# Patient Record
Sex: Female | Born: 1985 | Hispanic: No | Marital: Married | Smoking: Never smoker
Health system: Southern US, Community
[De-identification: ages and names within clinical notes are randomized; demographics above are authoritative.]

## PROBLEM LIST (undated history)

## (undated) DIAGNOSIS — Z789 Other specified health status: Secondary | ICD-10-CM

---

## 2020-07-04 NOTE — L&D Delivery Note (Signed)
OB/GYN Faculty Practice Delivery Note  Paula Shea is a 34 y.o. G3P2002 at [redacted]w[redacted]d admitted for IOL d/t IUFD.   GBS Status: unknown   Maximum Maternal Temperature: 98.8  Labor course: Initial SVE: closed. Augmentation with: AROM and Cytotec. She then progressed to complete.  ROM: 0h 43m with dark, red, bloody fluid  Birth: At 0228 a non-viable female was delivered via vaginal birth after cesarean (VBAC) (Presentation: OA). Nuchal cord present: Yes. Nuchal cord was looped around the neck x 2, tight and tightly twisted. Cord was clamped x 2 and cut to reduce from neck. Shoulders and body delivered in usual fashion. Infant placed directly on mom's abdomen for skin-to-skin, per patient request. Cord clamped x 2 and cut by CNM.  Cord blood collected.    Pitocin infused rapidly IV per protocol. Call to Dr. Despina Hidden @ [phone] to notify that 30 minute mark since delivery is approaching what recommendations for removal of placenta. Dr. Despina Hidden recommends rectal Cytotec 800 mcg be given and patiently await delivery of placenta.   TC @ 0426 reporting trickling of blood and QBL of . TC @ 0434 reporting patient being pale, hypotensive. TC to Dr. Despina Hidden @ [phone] to update on patient status and come to room and evaluate retained placenta.  Failed attempt by CNM to manually remove @ 0443. Dr. Despina Hidden in room @ [phone].   The placenta manually removed by Dr. Despina Hidden @ [phone]. Fundus firm with massage. Placenta inspected and appears to be intact with an undeterminable number VC.  Placenta/Cord with the following complications: entire length of cord was engorged with dark red, blood, loop of cord that was around fetal neck was severely twisted in one area; which appeared to have cut off blood supply to fetus.  Cord pH: n/a Sponge and instrument count were correct x2.  Intrapartum complications:  Fetal/Neonatal Death Anesthesia:  epidural Episiotomy: none Lacerations:  none Suture Repair:  n/a EBL (mL): 1574   Infant: APGAR (1  MIN): 0   APGAR (5 MINS): 0   Infant weight: pending  Mom to  OBSCU .  Baby  with mother then to morgue . Placenta to Pathology for IUFD    Plans to  n/a Contraception:  unsure Circumcision: N/A  Note sent to [facility]: [facility] for pp visit.  Raelyn Mora, MSN, CNM 01/24/2021 3:51 AM

## 2020-08-21 ENCOUNTER — Encounter (HOSPITAL_COMMUNITY): Payer: Self-pay

## 2020-08-21 ENCOUNTER — Ambulatory Visit (HOSPITAL_COMMUNITY)
Admission: EM | Admit: 2020-08-21 | Discharge: 2020-08-21 | Disposition: A | Discharge status: Home / Self Care | Payer: Self-pay | Attending: Medical Oncology | Admitting: Medical Oncology

## 2020-08-21 ENCOUNTER — Other Ambulatory Visit: Payer: Self-pay

## 2020-08-21 DIAGNOSIS — B354 Tinea corporis: Secondary | ICD-10-CM

## 2020-08-21 DIAGNOSIS — Z3202 Encounter for pregnancy test, result negative: Secondary | ICD-10-CM

## 2020-08-21 DIAGNOSIS — N3001 Acute cystitis with hematuria: Secondary | ICD-10-CM

## 2020-08-21 DIAGNOSIS — R109 Unspecified abdominal pain: Secondary | ICD-10-CM

## 2020-08-21 LAB — POCT URINALYSIS DIPSTICK, ED / UC
Bilirubin Urine: NEGATIVE
Glucose, UA: NEGATIVE mg/dL
Ketones, ur: NEGATIVE mg/dL
Nitrite: NEGATIVE
Protein, ur: 30 mg/dL — AB
Specific Gravity, Urine: 1.01 (ref 1.005–1.030)
Urobilinogen, UA: 0.2 mg/dL (ref 0.0–1.0)
pH: 5.5 (ref 5.0–8.0)

## 2020-08-21 LAB — POC URINE PREG, ED: Preg Test, Ur: NEGATIVE

## 2020-08-21 MED ORDER — PRENATAL COMPLETE 14-0.4 MG PO TABS: 1.0000 | ORAL_TABLET | Freq: Every day | ORAL | 3 refills | Status: DC

## 2020-08-21 MED ORDER — CLOTRIMAZOLE 1 % EX CREA: TOPICAL_CREAM | CUTANEOUS | 0 refills | Status: DC

## 2020-08-21 MED ORDER — NITROFURANTOIN MONOHYD MACRO 100 MG PO CAPS: 100.0000 mg | ORAL_CAPSULE | Freq: Two times a day (BID) | ORAL | 0 refills | Status: DC

## 2020-08-21 NOTE — ED Provider Notes (Signed)
[facility]    CSN: [id number] Arrival date & time: 08/21/20  1420      History   Chief Complaint Dysuria   HPI Kaysee Hergert is a 35 y.o. female. PT accompanied by [facility] representative as well as a medical interpretor via phone.   HPI   Abdominal Pain: PT reports that for the past 3 days she has had lower abdominal pain. Mainly in the RLQ area. She has also had urinary frequency, low back pain, dysuria and hematuria. Symptoms are similar to past UTIs. Symptoms started after sexual intercourse with her husband. She denies fever, chills, V/D. She has not taken anything for symptoms. No recent UTI. She would like a pregnancy test as she often only gets UTIs when pregnant    History reviewed. No pertinent past medical history.  There are no problems to display for this patient.   Past Surgical History:  Procedure Laterality Date  . CESAREAN SECTION      OB History   No obstetric history on file.      Home Medications    Prior to Admission medications   Not on File    Family History History reviewed. No pertinent family history.  Social History Social History   Tobacco Use  . Smoking status: Never Smoker  . Smokeless tobacco: Never Used  Substance Use Topics  . Alcohol use: Never  . Drug use: Never     Allergies   Patient has no known allergies.   Review of Systems Review of Systems  As stated above in HPI  Physical Exam Triage Vital Signs ED Triage Vitals [08/21/20 1540]  Enc Vitals Group     BP 120/81     Pulse Rate 74     Resp 18     Temp 98.6 F (37 C)     Temp Source Oral     SpO2 100 %     Weight      Height      Head Circumference      Peak Flow      Pain Score      Pain Loc      Pain Edu?      Excl. in GC?    No data found.  Updated Vital Signs BP 120/81 (BP Location: Left Arm)   Pulse 74   Temp 98.6 F (37 C) (Oral)   Resp 18   LMP 08/08/2020   SpO2 100%   Physical Exam Vitals and nursing note reviewed.   Constitutional:      General: She is not in acute distress.    Appearance: Normal appearance. She is not ill-appearing, toxic-appearing or diaphoretic.  HENT:     Head: Normocephalic.     Mouth/Throat:     Mouth: Mucous membranes are moist.  Eyes:     Comments: No evidence of pallor  Cardiovascular:     Rate and Rhythm: Normal rate and regular rhythm.     Heart sounds: Normal heart sounds.  Pulmonary:     Effort: Pulmonary effort is normal.     Breath sounds: Normal breath sounds.  Abdominal:     General: Abdomen is flat. Bowel sounds are normal. There is no distension.     Palpations: Abdomen is soft. There is no mass.     Tenderness: There is no abdominal tenderness. There is no right CVA tenderness, left CVA tenderness, guarding or rebound.     Hernia: No hernia is present.  Musculoskeletal:     Cervical  back: Neck supple.  Lymphadenopathy:     Cervical: No cervical adenopathy.  Skin:    General: Skin is warm and dry.  Neurological:     Mental Status: She is alert.      UC Treatments / Results  Labs (all labs ordered are listed, but only abnormal results are displayed) Labs Reviewed  POCT URINALYSIS DIPSTICK, ED / UC - Abnormal; Notable for the following components:      Result Value   Hgb urine dipstick LARGE (*)    Protein, ur 30 (*)    Leukocytes,Ua SMALL (*)    All other components within normal limits  POC URINE PREG, ED    EKG   Radiology No results found.  Procedures Procedures (including critical care time)  Medications Ordered in UC Medications - No data to display  Initial Impression / Assessment and Plan / UC Course  I have reviewed the triage vital signs and the nursing notes.  Pertinent labs & imaging results that were available during my care of the patient were reviewed by me and considered in my medical decision making (see chart for details).     New. Likely UTI.  Sending in [place] for her.  Discussed how to use along with common  potential side effects and precautions.  Culture pending.  If symptoms fail to improve over the next 3 days she will let us know.  Instructed her to drink plenty of fluids and to try to urinate after sexual intercourse to help avoid UTI eyes in the future.  As she is planning for pregnancy and does not have access to prenatals at this time I am going to send these to the pharmacy for her.  She also at the end of our visit shows me a well demarcated slightly hyperpigmented rash with clearing of her arm.  I will send an antifungal cream for her and I discussed how to use this.  She is aware that it can take a few months to fully resolve. Final Clinical Impressions(s) / UC Diagnoses   Final diagnoses:  None   Discharge Instructions   None    ED Prescriptions    None     PDMP not reviewed this encounter.   Rushie Chestnut, [place] 08/21/20 1628

## 2020-08-21 NOTE — ED Triage Notes (Signed)
Pt c/o RLQ abdominal pain, right lower back/flank pain, burning with urination, hematuria onset approx 3 days.  Denies fever, chills, v/d.  No OTC meds for symptoms States h/o recurrent UTIs.

## 2020-10-16 LAB — OB RESULTS CONSOLE PLATELET COUNT: Platelets: 246

## 2020-10-16 LAB — OB RESULTS CONSOLE RPR: RPR: NONREACTIVE

## 2020-10-16 LAB — OB RESULTS CONSOLE TSH: TSH: 5.07

## 2020-10-16 LAB — OB RESULTS CONSOLE HGB/HCT, BLOOD
HCT: 38 (ref 29–41)
Hemoglobin: 12.5

## 2020-10-16 LAB — HIV ANTIBODY (ROUTINE TESTING W REFLEX): HIV Screen 4th Generation wRfx: NONREACTIVE

## 2020-10-16 LAB — HEPATITIS C ANTIBODY: HCV Ab: NEGATIVE

## 2020-10-16 LAB — GLUCOSE, FASTING: Glucose Fasting: 78

## 2020-12-07 ENCOUNTER — Encounter: Payer: Self-pay | Admitting: *Deleted

## 2020-12-14 DIAGNOSIS — Z3400 Encounter for supervision of normal first pregnancy, unspecified trimester: Secondary | ICD-10-CM | POA: Insufficient documentation

## 2020-12-14 DIAGNOSIS — Z3492 Encounter for supervision of normal pregnancy, unspecified, second trimester: Secondary | ICD-10-CM

## 2020-12-14 HISTORY — DX: Encounter for supervision of normal first pregnancy, unspecified trimester: Z34.00

## 2020-12-24 ENCOUNTER — Ambulatory Visit (INDEPENDENT_AMBULATORY_CARE_PROVIDER_SITE_OTHER): Payer: Medicaid Other | Admitting: Family Medicine

## 2020-12-24 ENCOUNTER — Other Ambulatory Visit (HOSPITAL_COMMUNITY)
Admission: RE | Admit: 2020-12-24 | Discharge: 2020-12-24 | Disposition: A | Discharge status: Home / Self Care | Payer: Medicaid Other | Source: Ambulatory Visit | Attending: Family Medicine | Admitting: Family Medicine

## 2020-12-24 ENCOUNTER — Other Ambulatory Visit: Payer: Self-pay

## 2020-12-24 ENCOUNTER — Encounter: Payer: Self-pay | Admitting: Family Medicine

## 2020-12-24 VITALS — BP 104/67 | HR 77 | Ht 64.0 in | Wt 168.0 lb

## 2020-12-24 DIAGNOSIS — Z3492 Encounter for supervision of normal pregnancy, unspecified, second trimester: Secondary | ICD-10-CM | POA: Diagnosis not present

## 2020-12-24 DIAGNOSIS — Z3400 Encounter for supervision of normal first pregnancy, unspecified trimester: Secondary | ICD-10-CM

## 2020-12-24 DIAGNOSIS — Z3A18 18 weeks gestation of pregnancy: Secondary | ICD-10-CM | POA: Diagnosis not present

## 2020-12-24 DIAGNOSIS — O34219 Maternal care for unspecified type scar from previous cesarean delivery: Secondary | ICD-10-CM

## 2020-12-24 DIAGNOSIS — Z98891 History of uterine scar from previous surgery: Secondary | ICD-10-CM | POA: Insufficient documentation

## 2020-12-24 LAB — POCT URINALYSIS DIP (DEVICE)
Bilirubin Urine: NEGATIVE
Glucose, UA: NEGATIVE mg/dL
Ketones, ur: NEGATIVE mg/dL
Leukocytes,Ua: NEGATIVE
Nitrite: NEGATIVE
Protein, ur: NEGATIVE mg/dL
Specific Gravity, Urine: 1.025 (ref 1.005–1.030)
Urobilinogen, UA: 0.2 mg/dL (ref 0.0–1.0)
pH: 7 (ref 5.0–8.0)

## 2020-12-24 MED ORDER — BLOOD PRESSURE KIT DEVI: 1.0000 | 0 refills | Status: DC | PRN

## 2020-12-24 NOTE — Progress Notes (Signed)
INITIAL PRENATAL VISIT  Subjective:   Paula Shea is being seen today for her first obstetrical visit.  This is a planned pregnancy. This is a desired pregnancy.  She is at [redacted]w[redacted]d gestation by LMP.  Her obstetrical history is significant for  CSx2 . Relationship with FOB: spouse, living together. Patient does intend to breast feed. Pregnancy history fully reviewed.  Patient reports no complaints.  Indications for ASA therapy (per uptodate) One of the following: Previous pregnancy with preeclampsia, especially early onset and with an adverse outcome No Multifetal gestation No Chronic hypertension No Type 1 or 2 diabetes mellitus No Chronic kidney disease No Autoimmune disease (antiphospholipid syndrome, systemic lupus erythematosus) No  Two or more of the following: Nulliparity No Obesity (body mass index >30 kg/m2) No Family history of preeclampsia in mother or sister No Age ?35 years No Sociodemographic characteristics (African American race, low socioeconomic level) No Personal risk factors (eg, previous pregnancy with low birth weight or small for gestational age infant, previous adverse pregnancy outcome [eg, stillbirth], interval >10 years between pregnancies) No  Indications for early GDM screening  First-degree relative with diabetes No BMI >30kg/m2 No Age > 25 No Previous birth of an infant weighing ?4000 g No Gestational diabetes mellitus in a previous pregnancy No Glycated hemoglobin ?5.7 percent (39 mmol/mol), impaired glucose tolerance, or impaired fasting glucose on previous testing No High-risk race/ethnicity (eg, African American, Latino, Native American, Asian American, Pacific Islander) No Previous stillbirth of unknown cause No Maternal birthweight > 9 lbs No History of cardiovascular disease No Hypertension or on therapy for hypertension No High-density lipoprotein cholesterol level <35 mg/dL (9.50 mmol/L) and/or a triglyceride level >250 mg/dL (9.32  mmol/L) No Polycystic ovary syndrome No Physical inactivity No Other clinical condition associated with insulin resistance (eg, severe obesity, acanthosis nigricans) No Current use of glucocorticoids No   Early screening tests: FBS, A1C, Random CBG, glucose challenge   Review of Systems:   Review of Systems  Objective:    Obstetric History OB History  Gravida Para Term Preterm AB Living  3 2 2  0 0 2  SAB IAB Ectopic Multiple Live Births  0 0 0 0 2    # Outcome Date GA Lbr Len/2nd Weight Sex Delivery Anes PTL Lv  3 Current           2 Term 01/07/17 [redacted]w[redacted]d  8 lb 13.1 oz (4 kg) M CS-Unspec EPI N LIV  1 Term 02/15/15 [redacted]w[redacted]d  8 lb 13.1 oz (4 kg) M CS-Unspec EPI  LIV    History reviewed. No pertinent past medical history.  Past Surgical History:  Procedure Laterality Date   CESAREAN SECTION     2x    Current Outpatient Medications on File Prior to Visit  Medication Sig Dispense Refill   clotrimazole (LOTRIMIN) 1 % cream Apply to affected area 2 times daily (Patient not taking: Reported on 12/24/2020) 15 g 0   nitrofurantoin, macrocrystal-monohydrate, (MACROBID) 100 MG capsule Take 1 capsule (100 mg total) by mouth 2 (two) times daily. (Patient not taking: Reported on 12/24/2020) 10 capsule 0   ondansetron (ZOFRAN) 4 MG tablet Take 4 mg by mouth every 8 (eight) hours as needed for nausea or vomiting. (Patient not taking: Reported on 12/24/2020)     Prenatal Vit-Fe Fumarate-FA (PRENATAL COMPLETE) 14-0.4 MG TABS Take 1 tablet by mouth daily. (Patient not taking: Reported on 12/24/2020) 60 tablet 3   No current facility-administered medications on file prior to visit.    No  Known Allergies  Social History:  reports that she has never smoked. She has never used smokeless tobacco. She reports that she does not drink alcohol and does not use drugs.  History reviewed. No pertinent family history.  The following portions of the patient's history were reviewed and updated as  appropriate: allergies, current medications, past family history, past medical history, past social history, past surgical history and problem list.  Review of Systems Review of Systems    Physical Exam:  BP 104/67   Pulse 77   Ht 5\' 4"  (1.626 m)   Wt 168 lb (76.2 kg)   LMP 08/14/2020   BMI 28.84 kg/m  CONSTITUTIONAL: Well-developed, well-nourished female in no acute distress.  HENT:  Normocephalic, atraumatic, External right and left ear normal. Oropharynx is clear and moist EYES: Conjunctivae normal. No scleral icterus.  NECK: Normal range of motion, supple, no masses.  Normal thyroid.  SKIN: Skin is warm and dry. No rash noted. Not diaphoretic. No erythema. No pallor. MUSCULOSKELETAL: Normal range of motion. No tenderness.  No cyanosis, clubbing, or edema.   NEUROLOGIC: Alert and oriented to person, place, and time. Normal muscle tone coordination.  PSYCHIATRIC: Normal mood and affect. Normal behavior. Normal judgment and thought content. CARDIOVASCULAR: Normal heart rate noted, regular rhythm RESPIRATORY: Clear to auscultation bilaterally. Effort and breath sounds normal, no problems with respiration noted. BREASTS: Symmetric in size. No masses, skin changes, nipple drainage, or lymphadenopathy. ABDOMEN: Soft, normal bowel sounds, no distention noted.  No tenderness, rebound or guarding. Fundal ht: 18 PELVIC: Normal appearing external genitalia; normal appearing vaginal mucosa and cervix.  No abnormal discharge noted.  Pap smear obtained.  Normal uterine size, no other palpable masses, no uterine or adnexal tenderness. FHR: 157   Assessment:    Pregnancy: G1P0000 1. Supervision of low-risk pregnancy, second trimester - Hemoglobin A1c - Genetic Screening - Culture, OB Urine - AFP, Serum, Open Spina Bifida - Cytology - PAP( [place]) - Cervicovaginal ancillary only( [place]) - CBC/D/Plt+RPR+Rh+ABO+RubIgG...  2. Supervision of normal first pregnancy, antepartum Up to  date  3. H/O cesarean section complicating pregnancy RCS     Plan:     Initial labs drawn. Prenatal vitamins. Problem list reviewed and updated. Reviewed in detail the nature of the practice with collaborative care between  Genetic screening discussed: NIPS/First trimester screen/Quad/AFP requested. Role of ultrasound in pregnancy discussed; Anatomy 10/12/2020: requested. Amniocentesis discussed: not indicated. Follow up in 4 weeks. Discussed clinic routines, schedule of care and testing, genetic screening options, involvement of students and residents under the direct supervision of APPs and doctors and presence of female providers. Pt verbalized understanding.   [place], MD 12/24/2020 3:21 PM

## 2020-12-25 ENCOUNTER — Other Ambulatory Visit: Payer: Self-pay | Admitting: Obstetrics & Gynecology

## 2020-12-25 DIAGNOSIS — Z3492 Encounter for supervision of normal pregnancy, unspecified, second trimester: Secondary | ICD-10-CM

## 2020-12-25 LAB — CERVICOVAGINAL ANCILLARY ONLY
Bacterial Vaginitis (gardnerella): NEGATIVE
Candida Glabrata: NEGATIVE
Candida Vaginitis: NEGATIVE
Chlamydia: NEGATIVE
Comment: NEGATIVE
Comment: NEGATIVE
Comment: NEGATIVE
Comment: NEGATIVE
Comment: NEGATIVE
Comment: NORMAL
Neisseria Gonorrhea: NEGATIVE
Trichomonas: NEGATIVE

## 2020-12-25 MED ORDER — PRENATAL COMPLETE 14-0.4 MG PO TABS
1.0000 | ORAL_TABLET | Freq: Every day | ORAL | 3 refills | Status: DC
Start: 2020-12-25 — End: 2021-02-19

## 2020-12-26 LAB — AFP, SERUM, OPEN SPINA BIFIDA
AFP MoM: 1.04
AFP Value: 47.9 ng/mL
Gest. Age on Collection Date: 18.6 weeks
Maternal Age At EDD: 33.7 yr
OSBR Risk 1 IN: 10000
Test Results:: NEGATIVE
Weight: 168 [lb_av]

## 2020-12-26 LAB — CBC/D/PLT+RPR+RH+ABO+RUBIGG...
Antibody Screen: NEGATIVE
Basophils Absolute: 0 10*3/uL (ref 0.0–0.2)
Basos: 1 %
EOS (ABSOLUTE): 0.1 10*3/uL (ref 0.0–0.4)
Eos: 1 %
HCV Ab: 0.1 s/co ratio (ref 0.0–0.9)
HIV Screen 4th Generation wRfx: NONREACTIVE
Hematocrit: 36.6 % (ref 34.0–46.6)
Hemoglobin: 12.6 g/dL (ref 11.1–15.9)
Hepatitis B Surface Ag: NEGATIVE
Immature Grans (Abs): 0 10*3/uL (ref 0.0–0.1)
Immature Granulocytes: 0 %
Lymphocytes Absolute: 1.6 10*3/uL (ref 0.7–3.1)
Lymphs: 21 %
MCH: 31.3 pg (ref 26.6–33.0)
MCHC: 34.4 g/dL (ref 31.5–35.7)
MCV: 91 fL (ref 79–97)
Monocytes Absolute: 0.4 10*3/uL (ref 0.1–0.9)
Monocytes: 5 %
Neutrophils Absolute: 5.4 10*3/uL (ref 1.4–7.0)
Neutrophils: 72 %
Platelets: 289 10*3/uL (ref 150–450)
RBC: 4.03 x10E6/uL (ref 3.77–5.28)
RDW: 12.4 % (ref 11.7–15.4)
RPR Ser Ql: NONREACTIVE
Rh Factor: POSITIVE
Rubella Antibodies, IGG: 2.86 index (ref >0.99)
WBC: 7.5 10*3/uL (ref 3.4–10.8)

## 2020-12-26 LAB — HEMOGLOBIN A1C
Est. average glucose Bld gHb Est-mCnc: 97 mg/dL
Hgb A1c MFr Bld: 5 % (ref 4.8–5.6)

## 2020-12-26 LAB — URINE CULTURE, OB REFLEX

## 2020-12-26 LAB — CULTURE, OB URINE

## 2020-12-26 LAB — HCV INTERPRETATION

## 2020-12-28 LAB — CYTOLOGY - PAP
Comment: NEGATIVE
Diagnosis: NEGATIVE
High risk HPV: NEGATIVE

## 2021-01-05 ENCOUNTER — Other Ambulatory Visit: Payer: Self-pay | Admitting: Lactation Services

## 2021-01-05 NOTE — Progress Notes (Signed)
Called pharmacy to clarify prescription for PNV. They report they could not obtain most recent prescription but have one on file from February that can fill. No new orders needed at this time.

## 2021-01-15 ENCOUNTER — Other Ambulatory Visit: Payer: Self-pay

## 2021-01-15 ENCOUNTER — Encounter (HOSPITAL_COMMUNITY): Payer: Self-pay | Admitting: Emergency Medicine

## 2021-01-15 ENCOUNTER — Ambulatory Visit (HOSPITAL_COMMUNITY)
Admission: EM | Admit: 2021-01-15 | Discharge: 2021-01-15 | Disposition: A | Discharge status: Home / Self Care | Payer: Medicaid Other

## 2021-01-15 ENCOUNTER — Emergency Department (HOSPITAL_COMMUNITY)
Admission: EM | Admit: 2021-01-15 | Discharge: 2021-01-15 | Disposition: A | Discharge status: Home / Self Care | Payer: Medicaid Other | Attending: Emergency Medicine | Admitting: Emergency Medicine

## 2021-01-15 ENCOUNTER — Emergency Department (HOSPITAL_BASED_OUTPATIENT_CLINIC_OR_DEPARTMENT_OTHER): Payer: Medicaid Other

## 2021-01-15 DIAGNOSIS — Z3A20 20 weeks gestation of pregnancy: Secondary | ICD-10-CM | POA: Diagnosis not present

## 2021-01-15 DIAGNOSIS — M7989 Other specified soft tissue disorders: Secondary | ICD-10-CM

## 2021-01-15 DIAGNOSIS — L03115 Cellulitis of right lower limb: Secondary | ICD-10-CM

## 2021-01-15 DIAGNOSIS — W57XXXA Bitten or stung by nonvenomous insect and other nonvenomous arthropods, initial encounter: Secondary | ICD-10-CM | POA: Insufficient documentation

## 2021-01-15 DIAGNOSIS — M79604 Pain in right leg: Secondary | ICD-10-CM | POA: Diagnosis not present

## 2021-01-15 DIAGNOSIS — O99711 Diseases of the skin and subcutaneous tissue complicating pregnancy, first trimester: Secondary | ICD-10-CM | POA: Diagnosis not present

## 2021-01-15 DIAGNOSIS — O9A211 Injury, poisoning and certain other consequences of external causes complicating pregnancy, first trimester: Secondary | ICD-10-CM | POA: Diagnosis not present

## 2021-01-15 DIAGNOSIS — M79661 Pain in right lower leg: Secondary | ICD-10-CM | POA: Insufficient documentation

## 2021-01-15 DIAGNOSIS — Z3A01 Less than 8 weeks gestation of pregnancy: Secondary | ICD-10-CM | POA: Diagnosis not present

## 2021-01-15 DIAGNOSIS — S80861A Insect bite (nonvenomous), right lower leg, initial encounter: Secondary | ICD-10-CM | POA: Insufficient documentation

## 2021-01-15 MED ORDER — CEPHALEXIN 250 MG PO CAPS
500.0000 mg | ORAL_CAPSULE | Freq: Once | ORAL | Status: AC
  Administered 2021-01-15: 500 mg via ORAL
  Filled 2021-01-15: qty 2

## 2021-01-15 MED ORDER — CEPHALEXIN 500 MG PO CAPS: 500.0000 mg | ORAL_CAPSULE | Freq: Three times a day (TID) | ORAL | 0 refills | Status: AC

## 2021-01-15 MED ORDER — ACETAMINOPHEN 325 MG PO TABS: 650.0000 mg | ORAL_TABLET | Freq: Four times a day (QID) | ORAL | 0 refills | Status: DC | PRN

## 2021-01-15 MED ORDER — ACETAMINOPHEN 500 MG PO TABS
1000.0000 mg | ORAL_TABLET | Freq: Once | ORAL | Status: AC
  Administered 2021-01-15: 1000 mg via ORAL
  Filled 2021-01-15: qty 2

## 2021-01-15 NOTE — ED Provider Notes (Signed)
[facility] EMERGENCY DEPARTMENT Provider Note   CSN: [id number] Arrival date & time: 01/15/21  1831     History Chief Complaint  Patient presents with   Insect Bite    Paula Shea is a 35 y.o. female who reports she is [redacted] weeks gestation present emergency department with pain in her right leg.  The patient reports she was stung by mosquitoes 3 days ago.  This occurred on her right lower leg.  She reports she has had swelling and pain in her right leg.  She has taken a small dose of Tylenol at home but no other medicines.  She denies fevers or chills.  She has never had this issue before.  She reports no other complications with her pregnancy.  She reports no known drug allergies.  HPI     History reviewed. No pertinent past medical history.  Patient Active Problem List   Diagnosis Date Noted   H/O cesarean section complicating pregnancy 12/24/2020   Supervision of normal first pregnancy, antepartum 12/14/2020    Past Surgical History:  Procedure Laterality Date   CESAREAN SECTION     2x     OB History     Gravida  3   Para  2   Term  2   Preterm  0   AB  0   Living  2      SAB  0   IAB  0   Ectopic  0   Multiple  0   Live Births  2           History reviewed. No pertinent family history.  Social History   Tobacco Use   Smoking status: Never   Smokeless tobacco: Never  Substance Use Topics   Alcohol use: Never   Drug use: Never    Home Medications Prior to Admission medications   Medication Sig Start Date End Date Taking? Authorizing Provider  acetaminophen (TYLENOL) 325 MG tablet Take 2 tablets (650 mg total) by mouth every 6 (six) hours as needed for up to 30 doses (Pain). 01/15/21  Yes Demetrie Borge, Kermit Balo, MD  cephALEXin (KEFLEX) 500 MG capsule Take 1 capsule (500 mg total) by mouth 3 (three) times daily for 7 days. 01/15/21 01/22/21 Yes Athanasios Heldman, Kermit Balo, MD  Blood Pressure Monitoring (BLOOD PRESSURE KIT) DEVI 1  Device by Does not apply route as needed. 12/24/20   Federico Flake, MD  clotrimazole (LOTRIMIN) 1 % cream Apply to affected area 2 times daily Patient not taking: Reported on 12/24/2020 08/21/20   Rushie Chestnut, PA-C  nitrofurantoin, macrocrystal-monohydrate, (MACROBID) 100 MG capsule Take 1 capsule (100 mg total) by mouth 2 (two) times daily. Patient not taking: No sig reported 08/21/20   Rushie Chestnut, PA-C  ondansetron (ZOFRAN) 4 MG tablet Take 4 mg by mouth every 8 (eight) hours as needed for nausea or vomiting. Patient not taking: Reported on 12/24/2020    [provider]  Prenatal Vit-Fe Fumarate-FA (PRENATAL COMPLETE) 14-0.4 MG TABS Take 1 tablet by mouth daily. 12/25/20   Adam Phenix, MD    Allergies    Patient has no known allergies.  Review of Systems   Review of Systems  Constitutional:  Negative for chills and fever.  Eyes:  Negative for pain and visual disturbance.  Respiratory:  Negative for cough and shortness of breath.   Cardiovascular:  Negative for chest pain and palpitations.  Gastrointestinal:  Negative for abdominal pain and vomiting.  Genitourinary:  Negative for dysuria and hematuria.  Musculoskeletal:  Positive for arthralgias.  Skin:  Positive for rash. Negative for color change.  Neurological:  Negative for syncope and headaches.  All other systems reviewed and are negative.  Physical Exam Updated Vital Signs BP 123/84 (BP Location: Left Arm)   Pulse 68   Temp 98.5 F (36.9 C)   Resp 18   LMP 08/14/2020   SpO2 100%   Physical Exam Constitutional:      General: She is not in acute distress. HENT:     Head: Normocephalic and atraumatic.  Eyes:     Conjunctiva/sclera: Conjunctivae normal.     Pupils: Pupils are equal, round, and reactive to light.  Cardiovascular:     Rate and Rhythm: Normal rate and regular rhythm.  Pulmonary:     Effort: Pulmonary effort is normal. No respiratory distress.  Abdominal:     General:  There is no distension.     Tenderness: There is no abdominal tenderness.  Skin:    General: Skin is warm and dry.     Comments: Erythema and warmth of right lower leg involving top of foot to mid-calf  Neurological:     General: No focal deficit present.     Mental Status: She is alert and oriented to person, place, and time. Mental status is at baseline.     Sensory: No sensory deficit.     Motor: No weakness.  Psychiatric:        Mood and Affect: Mood normal.        Behavior: Behavior normal.    ED Results / Procedures / Treatments   Labs (all labs ordered are listed, but only abnormal results are displayed) Labs Reviewed - No data to display  EKG None  Radiology VAS [place] LOWER EXTREMITY VENOUS (DVT) (ONLY [facility] & [facility])  Result Date: 01/15/2021  Lower Venous DVT Study Patient Name:  BERANIA PEEDIN  Date of Exam:   01/15/2021 Medical Rec #: [id number]      Accession #:    [id number] Date of Birth: Mar 16, 1986       Patient Gender: F Patient Age:   35Y Exam Location:  [facility] Procedure:      VAS [place] LOWER EXTREMITY VENOUS (DVT) Referring Phys: [id number] Rudell Cobb BADALAMENTE --------------------------------------------------------------------------------  Indications: Pain, and Swelling.  Comparison Study: no prior Performing Technologist: Archie Patten RVS  Examination Guidelines: A complete evaluation includes B-mode imaging, spectral Doppler, color Doppler, and power Doppler as needed of all accessible portions of each vessel. Bilateral testing is considered an integral part of a complete examination. Limited examinations for reoccurring indications may be performed as noted. The reflux portion of the exam is performed with the patient in reverse Trendelenburg.  +---------+---------------+---------+-----------+----------+--------------+ RIGHT    CompressibilityPhasicitySpontaneityPropertiesThrombus Aging +---------+---------------+---------+-----------+----------+--------------+  CFV      Full           Yes      Yes                                 +---------+---------------+---------+-----------+----------+--------------+ SFJ      Full                                                        +---------+---------------+---------+-----------+----------+--------------+ FV  Prox  Full                                                        +---------+---------------+---------+-----------+----------+--------------+ FV Mid   Full                                                        +---------+---------------+---------+-----------+----------+--------------+ FV DistalFull                                                        +---------+---------------+---------+-----------+----------+--------------+ PFV      Full                                                        +---------+---------------+---------+-----------+----------+--------------+ POP      Full           Yes      Yes                                 +---------+---------------+---------+-----------+----------+--------------+ PTV      Full                                                        +---------+---------------+---------+-----------+----------+--------------+ PERO     Full                                                        +---------+---------------+---------+-----------+----------+--------------+   +----+---------------+---------+-----------+----------+--------------+ LEFTCompressibilityPhasicitySpontaneityPropertiesThrombus Aging +----+---------------+---------+-----------+----------+--------------+ CFV Full           Yes      Yes                                 +----+---------------+---------+-----------+----------+--------------+     Summary: RIGHT: - There is no evidence of deep vein thrombosis in the lower extremity.  - No cystic structure found in the popliteal fossa.  LEFT: - No evidence of common femoral vein obstruction.  *See table(s) above  for measurements and observations.    Preliminary     Procedures Procedures   Medications Ordered in ED Medications  acetaminophen (TYLENOL) tablet 1,000 mg (1,000 mg Oral Given 01/15/21 2148)  cephALEXin (KEFLEX) capsule 500 mg (500 mg Oral Given 01/15/21 2148)    ED Course  I have reviewed the triage vital signs and the nursing notes.  Pertinent labs & imaging results that were available during my care of  the patient were reviewed by me and considered in my medical decision making (see chart for details).  This is a well-appearing 35 year old female present emerged department with pain and warmth in her right leg after being stung by mosquito 3 days ago.  Clinically there is most consistent with a mild cellulitis.  She has no systemic signs of toxicity.  She is afebrile.  I doubt a deep space infection.  Vascular ultrasound did not show any evidence of a DVT.  We will start her on Tylenol for pain as well as Keflex for skin infection.  Per medical review, Keflex does appear to be safe to give in pregnancy.    Final Clinical Impression(s) / ED Diagnoses Final diagnoses:  Insect bite, unspecified site, initial encounter  Cellulitis of right lower extremity    Rx / DC Orders ED Discharge Orders          Ordered    cephALEXin (KEFLEX) 500 MG capsule  3 times daily        01/15/21 2119    acetaminophen (TYLENOL) 325 MG tablet  Every 6 hours PRN        01/15/21 2119             Wyvonnia Dusky, MD 01/15/21 2328

## 2021-01-15 NOTE — Discharge Instructions (Addendum)
You wre diagnosed with a skin infection in the emergency department.  This is called cellulitis.    You will need to take the antibiotic I prescribed for 7 days. The medicine is called Keflex.  You will take 1 pill by mouth three times per day - once in the morning, once with lunch, and once before bedtime.  Please finish the ENTIRE week of antibiotics.  Do not stop taking antibiotics early.  For pain at home you can take the Tylenol I prescribed.    These medicines are safe in pregnancy.  I expect your pain and redness to get better.  If it gets worse, you should return to the ER.  Please follow-up with the OB/GYN provider.  While you were here we also did a study to check for blood clots in your legs.  We did not see signs of blood clots in your legs.

## 2021-01-15 NOTE — ED Provider Notes (Signed)
[place]    CSN: [id number] Arrival date & time: 01/15/21  1723      History   Chief Complaint Chief Complaint  Patient presents with   Insect Bite    HPI Paula Shea is a 35 y.o. female presenting with R ankle pain and swelling following insect bite that occurred 3 days ago. She is currently 5 months pregnant.  Progressively worsening pain and swelling of her right ankle, now radiating through the calf and groin.  The swelling extends up to the knee.  Denies fever/chills, dizziness, shortness of breath, chest pain.  HPI  History reviewed. No pertinent past medical history.  Patient Active Problem List   Diagnosis Date Noted   H/O cesarean section complicating pregnancy 54/27/0623   Supervision of normal first pregnancy, antepartum 12/14/2020    Past Surgical History:  Procedure Laterality Date   CESAREAN SECTION     2x    OB History     Gravida  3   Para  2   Term  2   Preterm  0   AB  0   Living  2      SAB  0   IAB  0   Ectopic  0   Multiple  0   Live Births  2            Home Medications    Prior to Admission medications   Medication Sig Start Date End Date Taking? Authorizing Provider  Blood Pressure Monitoring (BLOOD PRESSURE KIT) DEVI 1 Device by Does not apply route as needed. 12/24/20   Caren Macadam, MD  clotrimazole (LOTRIMIN) 1 % cream Apply to affected area 2 times daily Patient not taking: Reported on 12/24/2020 08/21/20   Hughie Closs, PA-C  nitrofurantoin, macrocrystal-monohydrate, (MACROBID) 100 MG capsule Take 1 capsule (100 mg total) by mouth 2 (two) times daily. Patient not taking: No sig reported 08/21/20   Hughie Closs, PA-C  ondansetron (ZOFRAN) 4 MG tablet Take 4 mg by mouth every 8 (eight) hours as needed for nausea or vomiting. Patient not taking: Reported on 12/24/2020    [provider]  Prenatal Vit-Fe Fumarate-FA (PRENATAL COMPLETE) 14-0.4 MG TABS Take 1 tablet by mouth  daily. 12/25/20   Woodroe Mode, MD    Family History History reviewed. No pertinent family history.  Social History Social History   Tobacco Use   Smoking status: Never   Smokeless tobacco: Never  Substance Use Topics   Alcohol use: Never   Drug use: Never     Allergies   Patient has no known allergies.   Review of Systems Review of Systems  Musculoskeletal:        R calf swelling and tenderness  All other systems reviewed and are negative.   Physical Exam Triage Vital Signs ED Triage Vitals  Enc Vitals Group     BP 01/15/21 1757 119/72     Pulse Rate 01/15/21 1757 77     Resp 01/15/21 1757 18     Temp 01/15/21 1757 (!) 97.5 F (36.4 C)     Temp Source 01/15/21 1757 Tympanic     SpO2 01/15/21 1757 100 %     Weight --      Height --      Head Circumference --      Peak Flow --      Pain Score 01/15/21 1802 10     Pain Loc --      Pain Edu? --  Excl. in GC? --    No data found.  Updated Vital Signs BP 119/72   Pulse 77   Temp (!) 97.5 F (36.4 C) (Tympanic)   Resp 18   LMP 08/14/2020   SpO2 100%   Visual Acuity Right Eye Distance:   Left Eye Distance:   Bilateral Distance:    Right Eye Near:   Left Eye Near:    Bilateral Near:     Physical Exam Vitals reviewed.  Constitutional:      Appearance: Normal appearance.  HENT:     Head: Normocephalic and atraumatic.  Cardiovascular:     Rate and Rhythm: Normal rate.  Pulmonary:     Effort: Pulmonary effort is normal.  Musculoskeletal:     Comments: 2+ pitting edema R foot and calf. Positive homan sign. Swelling extends to knee.   Skin:    Capillary Refill: Capillary refill takes less than 2 seconds.  Neurological:     General: No focal deficit present.     Mental Status: She is alert and oriented to person, place, and time.  Psychiatric:        Mood and Affect: Mood normal.        Behavior: Behavior normal.        Thought Content: Thought content normal.        Judgment: Judgment  normal.     UC Treatments / Results  Labs (all labs ordered are listed, but only abnormal results are displayed) Labs Reviewed - No data to display  EKG   Radiology No results found.  Procedures Procedures (including critical care time)  Medications Ordered in UC Medications - No data to display  Initial Impression / Assessment and Plan / UC Course  I have reviewed the triage vital signs and the nursing notes.  Pertinent labs & imaging results that were available during my care of the patient were reviewed by me and considered in my medical decision making (see chart for details).     This patient is a very pleasant 35 y.o. year old female presenting with RLE edema and pain following insect bite. Afebrile, nontachy. Significant R LE swelling and positive homan sign, Wells score 4. Pain radiating to groin. Given time of day (6pm Friday), I am not able to order and outpatient ultrasound at this time. As patient is also 5 months pregnant and there is a language barrier, I am not comfortable managing this outpatient. I am recommending they head to ED for DVT rule out. Patient verbalizes understanding. Spoke with this patient using language line.  Final Clinical Impressions(s) / UC Diagnoses   Final diagnoses:  Swelling of right lower extremity  [redacted] weeks gestation of pregnancy     Discharge Instructions      Head to ED for DVT r/o     ED Prescriptions   None    PDMP not reviewed this encounter.   Hazel Sams, PA-C 01/15/21 1821

## 2021-01-15 NOTE — ED Triage Notes (Signed)
Pt sent from UC for further evaluation on insect bite on her right ankle, pt is five months pregnant having swollen ankle and knee since Wednesday after the bite.

## 2021-01-15 NOTE — ED Notes (Signed)
Patient is being discharged from the Urgent Care and sent to the Emergency Department via POV . Per Ignacia Bayley PA-C, patient is in need of higher level of care due to swelling and pain in her right foot radiating to her groin. Pt is also five months pregnant . Patient is aware and verbalizes understanding of plan of care.  Vitals:   01/15/21 1757  BP: 119/72  Pulse: 77  Resp: 18  Temp: (!) 97.5 F (36.4 C)  SpO2: 100%

## 2021-01-15 NOTE — ED Triage Notes (Addendum)
Pt is present today with a insect bite on her right ankle on Wednesday. Pt has visible  swelling in her ankle and knee. Pt states that pain radiates up her groin.  Pt is five months pregnant.

## 2021-01-15 NOTE — ED Provider Notes (Signed)
Emergency Medicine Provider Triage Evaluation Note  Paula Shea , a 35 y.o. female  was evaluated in triage.  Pt complains of swelling to right ankle and lower extremity.  Patient was sent from urgent care due to concern for possible DVT.    Patient states that she was bit by mosquito on Wednesday.  Since then she has had worsening swelling and redness to right foot and knee.  Review of Systems  Positive: Swelling, myalgia Negative: Numbness, weakness  Physical Exam  BP 118/80   Pulse 77   Temp 98.4 F (36.9 C) (Oral)   Resp 14   LMP 08/14/2020   SpO2 99%  Gen:   Awake, no distress   Resp:  Normal effort  MSK:   Moves extremities without difficulty  Other:    Medical Decision Making  Medically screening exam initiated at 6:48 PM.  Appropriate orders placed.  Mercia Lagoy was informed that the remainder of the evaluation will be completed by another provider, this initial triage assessment does not replace that evaluation, and the importance of remaining in the ED until their evaluation is complete.  Will place order for DVT based on patient's increased Wells DVT score.  Patient patient is negative for DVT consider antibiotic for cellulitis secondary to bug bite.    The patient appears stable so that the remainder of the work up may be completed by another provider.      Haskel Schroeder, PA-C 01/15/21 1850    Charlynne Pander, MD 01/15/21 [phone]

## 2021-01-15 NOTE — Progress Notes (Signed)
Lower extremity venous has been completed.   Preliminary results in CV Proc.   Blanch Media 01/15/2021 7:05 PM

## 2021-01-15 NOTE — ED Notes (Signed)
Dari Interpreter# [id number]

## 2021-01-15 NOTE — Discharge Instructions (Addendum)
Head to ED for DVT r/o

## 2021-01-19 ENCOUNTER — Encounter: Payer: Self-pay | Admitting: *Deleted

## 2021-01-20 ENCOUNTER — Encounter: Payer: Self-pay | Admitting: *Deleted

## 2021-01-20 ENCOUNTER — Ambulatory Visit: Payer: Medicaid Other | Attending: Family Medicine

## 2021-01-21 ENCOUNTER — Ambulatory Visit: Payer: Medicaid Other | Admitting: *Deleted

## 2021-01-21 ENCOUNTER — Ambulatory Visit: Payer: Medicaid Other | Attending: Obstetrics and Gynecology

## 2021-01-21 ENCOUNTER — Other Ambulatory Visit: Payer: Self-pay

## 2021-01-21 ENCOUNTER — Ambulatory Visit (INDEPENDENT_AMBULATORY_CARE_PROVIDER_SITE_OTHER): Payer: Medicaid Other | Admitting: Obstetrics and Gynecology

## 2021-01-21 ENCOUNTER — Ambulatory Visit: Payer: Medicaid Other | Attending: Obstetrics and Gynecology | Admitting: Obstetrics and Gynecology

## 2021-01-21 ENCOUNTER — Encounter: Payer: Medicaid Other | Admitting: Obstetrics and Gynecology

## 2021-01-21 VITALS — BP 113/62 | HR 67

## 2021-01-21 VITALS — BP 114/78 | HR 74 | Wt 168.6 lb

## 2021-01-21 DIAGNOSIS — O364XX0 Maternal care for intrauterine death, not applicable or unspecified: Secondary | ICD-10-CM

## 2021-01-21 DIAGNOSIS — Z3A22 22 weeks gestation of pregnancy: Secondary | ICD-10-CM

## 2021-01-21 DIAGNOSIS — O322XX0 Maternal care for transverse and oblique lie, not applicable or unspecified: Secondary | ICD-10-CM

## 2021-01-21 DIAGNOSIS — O368390 Maternal care for abnormalities of the fetal heart rate or rhythm, unspecified trimester, not applicable or unspecified: Secondary | ICD-10-CM | POA: Insufficient documentation

## 2021-01-21 DIAGNOSIS — Z3400 Encounter for supervision of normal first pregnancy, unspecified trimester: Secondary | ICD-10-CM

## 2021-01-21 DIAGNOSIS — O34219 Maternal care for unspecified type scar from previous cesarean delivery: Secondary | ICD-10-CM | POA: Insufficient documentation

## 2021-01-21 DIAGNOSIS — O368320 Maternal care for abnormalities of the fetal heart rate or rhythm, second trimester, not applicable or unspecified: Secondary | ICD-10-CM

## 2021-01-21 HISTORY — DX: Maternal care for abnormalities of the fetal heart rate or rhythm, unspecified trimester, not applicable or unspecified: O36.8390

## 2021-01-21 IMAGING — US US MFM OB LIMITED
1 series · 16 of 25 positions shown · non-contrast
Comparison: none

Addendum:\.br----------------------------------------------------------------------
----------------------------------------------------------------------

 1  US MFM OB LIMITED                     76815.01    LUIS DAVID P NARANJO MALIZA
Indications
 Maternal Care of intrauterine death, not
 applicable or unspecified
 22 weeks gestation of pregnancy
 Unable to hear fetal heart tones as reason     O76
 for ultrasound
 Previous cesarean delivery, antepartum
 Low risk NIPS, neg AFP/Horizon
Fetal Evaluation
 Num Of Fetuses:         1
 Cardiac Activity:       Absent
 Presentation:           Transverse, head to maternal left
 Placenta:               Anterior
 Amniotic Fluid
 AFI FV:      Subjectively decreased
                             Largest Pocket(cm)
Biometry
 BPD:      49.6  mm     G. Age:  21w 0d        2.2  %    CI:        67.76   %    70 - 86
                                                         FL/HC:      20.9   %    19.2 -
 HC:      192.8  mm     G. Age:  21w 4d        3.2  %    HC/AC:      1.09        1.05 -
 AC:      177.4  mm     G. Age:  22w 4d         34  %    FL/BPD:     81.0   %    71 - 87
 FL:       40.2  mm     G. Age:  23w 0d         43  %    FL/AC:      22.7   %    20 - 24
 Est. FW:     516  gm      1 lb 2 oz     29  %
OB History
 Gravidity:    3         Term:   2        Prem:   0        SAB:   0
 TOP:          0       Ectopic:  0        Living: 2
Gestational Age
 LMP:           22w 6d        Date:  08/14/20                 EDD:   05/21/21
 U/S Today:     22w 0d                                        EDD:   05/27/21
 Best:          22w 6d     Det. By:  LMP  (08/14/20)          EDD:   05/21/21
Cervix Uterus Adnexa
 Cervix
 Length:           4.23  cm.
 Normal appearance by transabdominal scan.
 Uterus
 No abnormality visualized.
 Right Ovary
 Not visualized.
 Left Ovary
 Cul De Sac
 No free fluid seen.
 Adnexa
Impression
 A limited ultrasound study was performed.  Unfortunately, no
 fetal heart activity seen.  Fetal biometry is consistent with 22
 weeks gestation.

[Series 1: us mfm ob limited · 25 acquisitions, 16 frames shown]
[im 1/25]
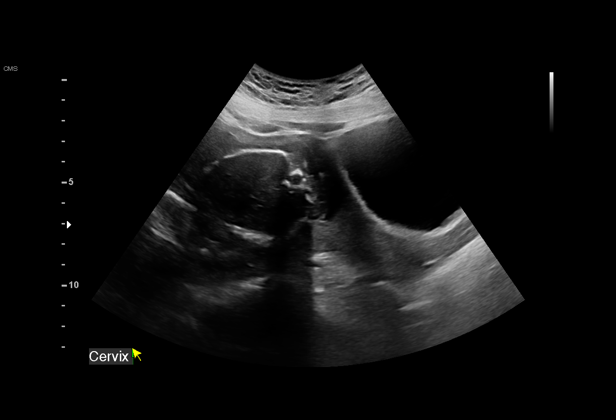
[im 3/25]
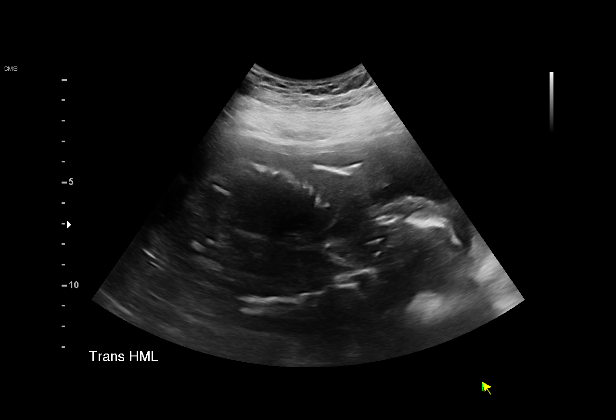
[im 4/25]
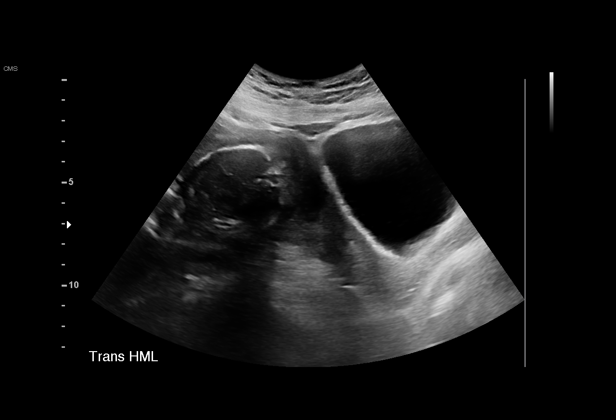
[im 6/25]
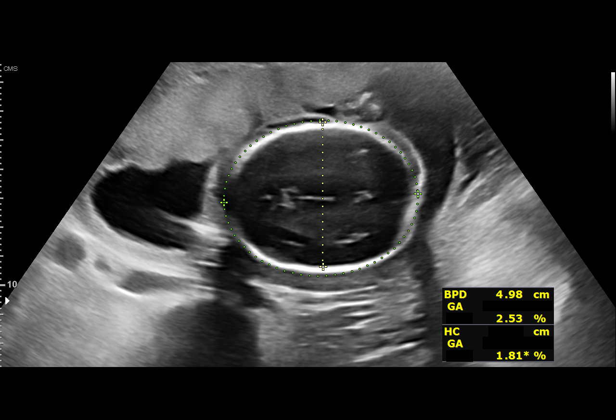
[im 8/25]
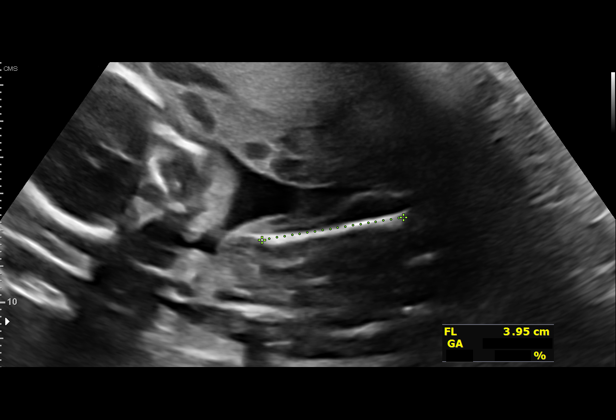
[im 9/25]
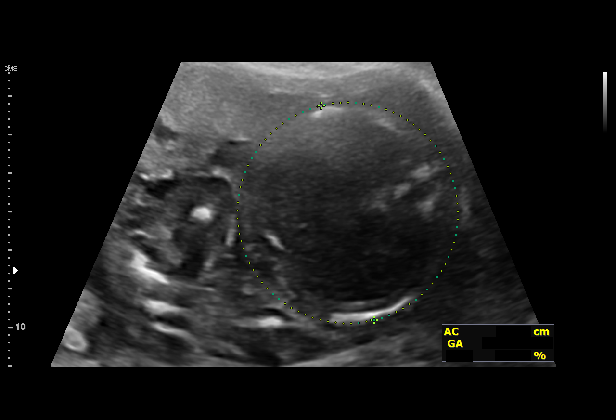
[im 11/25]
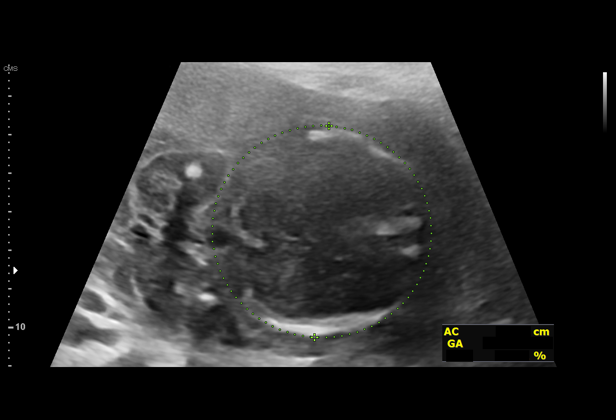
[im 12/25]
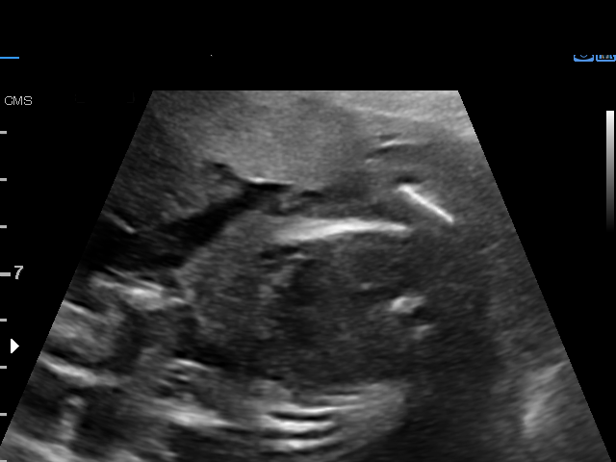
[im 14/25]
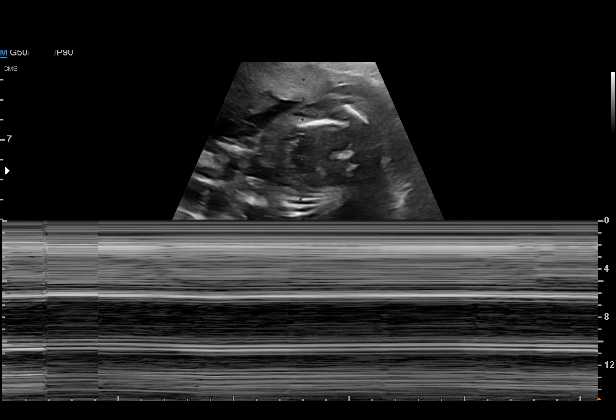
[im 15/25]
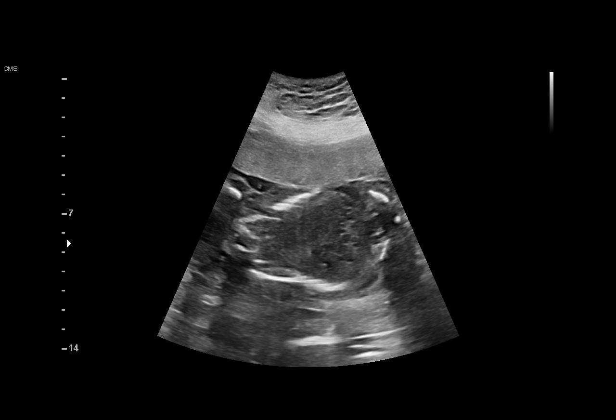
[im 17/25]
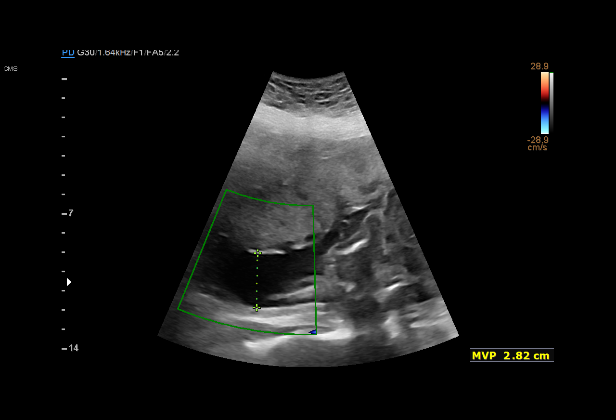
[im 18/25]
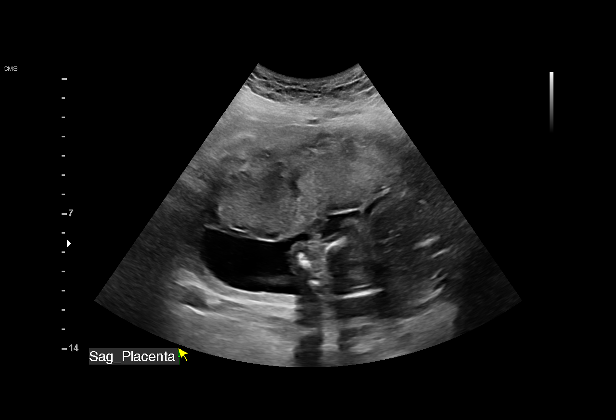
[im 20/25]
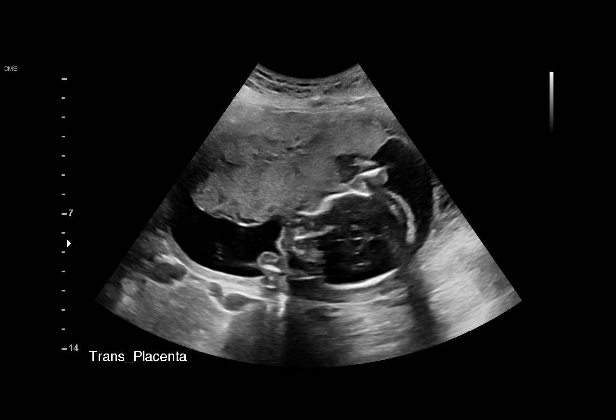
[im 22/25]
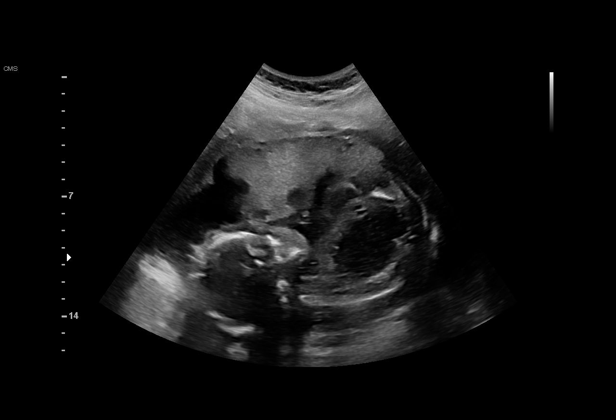
[im 23/25]
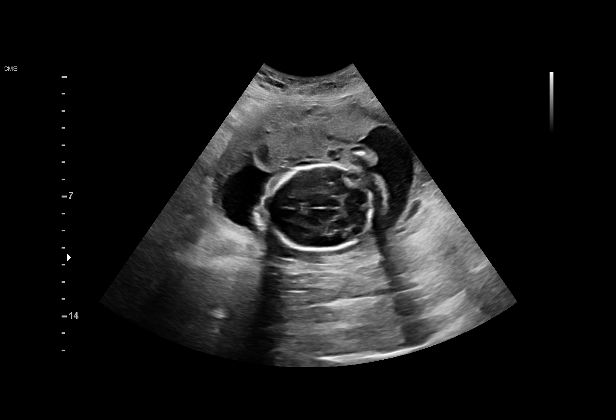
[im 25/25]
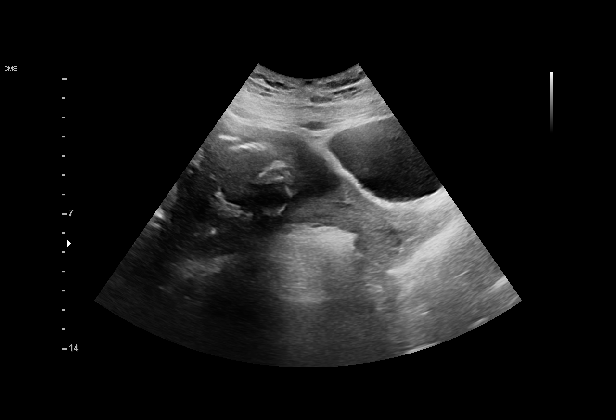

[16 of 25 positions shown; findings below may reference images not displayed]

IMPRESSION: Intrauterine fetal demise.
 [id number]
 Consultation

 Ms. Kbrera, Yilian3 P2 at 22-weeks' gestation, was sent over to
 our office after fetal heart tones were not heard on Doppler at
 your office.
 Patient does not give history of vaginal bleeding.  She
 complained of decreased fetal movements this morning.
 Obstetrical history significant for 2 term cesarean deliveries.
 She reports no chronic medical conditions.

 I counseled the patient with help of Arabic language
 interpreter present in the room.  I explained the possible
 causes including fetal chromosomal anomalies.  I discussed
 the option of amniocentesis.  Alternatively, fetal karyotype
 and MicroArray can be performed after delivery. Patient opted
 not to have amniocentesis.
 Patient had 2 previous cesarean deliveries and
 uncomplicated pregnancies.  She does not have any chronic
 medical conditions that could be attributed to fetal demise.

 I spoke to her husband on the phone.  Patient would like to
 go home and be with family.

 Discussed with Dr. Tahmina, OB attending on call.
 I informed the patient that she should expect a call from our
 hospital to schedule her induction of labor.
----------------------------------------------------------------------
Recommendations

 -Misoprostol induction of labor tomorrow
 -Postnatal fetal karyotype/microarray (PASWAN).
----------------------------------------------------------------------
                      [place], [place]
----------------------------------------------------------------------

*** End of Addendum ***\.br----------------------------------------------------------------------
----------------------------------------------------------------------

----------------------------------------------------------------------

----------------------------------------------------------------------

 1  US MFM OB LIMITED                     76815.01    LUIS DAVID P NARANJO MALIZA
----------------------------------------------------------------------

----------------------------------------------------------------------
Indications

 22 weeks gestation of pregnancy
 Unable to hear fetal heart tones as reason     O76
 for ultrasound
 Previous cesarean delivery, antepartum
 Low risk NIPS, neg AFP/Horizon
----------------------------------------------------------------------
Fetal Evaluation

 Num Of Fetuses:         1
 Cardiac Activity:       Absent
 Presentation:           Transverse, head to maternal left
 Placenta:               Anterior

 Amniotic Fluid
 AFI FV:      Subjectively decreased

                             Largest Pocket(cm)

----------------------------------------------------------------------
Biometry

 BPD:      49.6  mm     G. Age:  21w 0d        2.2  %    CI:        67.76   %    70 - 86
                                                         FL/HC:      20.9   %    19.2 -
 HC:      192.8  mm     G. Age:  21w 4d        3.2  %    HC/AC:      1.09        1.05 -
 AC:      177.4  mm     G. Age:  22w 4d         34  %    FL/BPD:     81.0   %    71 - 87
 FL:       40.2  mm     G. Age:  23w 0d         43  %    FL/AC:      22.7   %    20 - 24

 Est. FW:     516  gm      1 lb 2 oz     29  %
----------------------------------------------------------------------
OB History
 Gravidity:    3         Term:   2        Prem:   0        SAB:   0
 TOP:          0       Ectopic:  0        Living: 2
----------------------------------------------------------------------
Gestational Age

 LMP:           22w 6d        Date:  08/14/20                 EDD:   05/21/21
 U/S Today:     22w 0d                                        EDD:   05/27/21
 Best:          22w 6d     Det. By:  LMP  (08/14/20)          EDD:   05/21/21
----------------------------------------------------------------------
Cervix Uterus Adnexa

 Cervix
 Length:           4.23  cm.
 Normal appearance by transabdominal scan.

 Uterus
 No abnormality visualized.

 Right Ovary
 Not visualized.

 Left Ovary
 Not visualized.

 Cul De Sac
 No free fluid seen.

 Adnexa
 No abnormality visualized.
----------------------------------------------------------------------
Impression

 G3 P2.  Ultrasound was requested after fetal heart tones
 were not detected at your office.
 A limited ultrasound study was performed.  Unfortunately, no
 fetal heart activity seen.  Fetal biometry is consistent with 22
 weeks gestation.
IMPRESSION: Intrauterine fetal demise.

 I counseled the patient with help of Arabic language
 interpreter present in the room.  I explained the possible
 causes including fetal chromosomal anomalies.  I discussed
 the option of amniocentesis.  Alternatively, fetal karyotype
 and MicroArray can be performed after delivery.

 I spoke to her husband on the phone.  Patient would like to
 go home and be with family.

 Discussed with Dr. Tahmina, OB attending on call.
 I informed the patient that she should expect a call from our
 hospital to schedule her induction of labor.
----------------------------------------------------------------------
                 [place], [place]
----------------------------------------------------------------------

## 2021-01-21 NOTE — Progress Notes (Addendum)
Subjective:  Paula Shea is a 35 y.o. G3P2002 at [redacted]w[redacted]d being seen today for ongoing prenatal care.  She is currently monitored for the following issues for this low-risk pregnancy and has Supervision of normal first pregnancy, antepartum and H/O cesarean section complicating pregnancy on their problem list.  Patient reports  some cramping in upper abdomen for 1 day .  Contractions: Irritability. Vag. Bleeding: None.  Movement: Present (per patient). Denies leaking of fluid.   The following portions of the patient's history were reviewed and updated as appropriate: allergies, current medications, past family history, past medical history, past social history, past surgical history and problem list. Problem list updated.  Patient denies vaginal bleeding. Reports she had some upper abdominal discomfort 1 day ago. Denies any other cramping.   Objective:   Vitals:   01/21/21 1529  Weight: 168 lb 9.6 oz (76.5 kg)    Fetal Status: FHT not appreciated on doppler and bedside exam.  General:  Alert, oriented and cooperative. Patient is in no acute distress.  Skin: Skin is warm and dry. No rash noted.   Cardiovascular: Normal heart rate noted  Respiratory: Normal respiratory effort, no problems with respiration noted  Abdomen: Soft, gravid, appropriate for gestational age. Pain/Pressure: Present     Pelvic: Vag. Bleeding: None     Cervical exam deferred        Extremities: Normal range of motion.  Edema: Trace  Mental Status: Normal mood and affect. Normal behavior. Normal judgment and thought content.    Assessment and Plan:  Pregnancy: G3P2002 at [redacted]w[redacted]d  1. Supervision of normal first pregnancy, antepartum 2. No Fetal Heart Tone on doppler and bedside US Genetic screen negative, all other labs reviewed. No heart tones were appreciated on bedside doppler and on follow up bedside US with iPAD probe no fetal heart tone seen. OB comp [place] scheduled on 7/20 but patient not aware and did not get  [place].  At that time BSUS done after patient informed that the ultrasound is considered a limited OB ultrasound and is not intended to be a complete ultrasound exam.  Patient also informed that the ultrasound is not being completed with the intent of assessing for fetal or placental anomalies or any pelvic abnormalities.  Explained that the purpose of today's ultrasound is to assess for  viability.  No fetal heart tones were seen on fetal US and low fluid, patient sent to MFM for formal US for confirmation of viability of pregnancy. Patient acknowledges the purpose of the exam and the limitations of the study.   - Discussed with patient that no heartbeat seen on fetal US and need for confirmation on formal US. -sent to get formal Ultrasound today (01/21/2021) with plan for [place] tech to reach out to on call OB with results if no FHT on formal US. First call OB (Dr. Donavan Foil) notified by phone regarding patient.    Warner Mccreedy, MD, MPH OB Fellow, Faculty Practice  Attestation of Attending Supervision of Advanced Practitioner (CNM/NP): Evaluation and management procedures were performed by the Advanced Practitioner under my supervision and collaboration.  I performed the bedside ultrasound myself as well. I have reviewed the Advanced Practitioner's note and chart, and I agree with the management and plan.  Peggy Constant 01/21/2021 4:38 PM

## 2021-01-22 NOTE — Progress Notes (Signed)
Maternal-Fetal Medicine   Name: Paula Shea DOB: 1985/10/27 MRN: [id number] Referring Provider: Catalina Antigua, MD  Ms. Paula Libra, G3 P2 at 22-weeks' gestation, was sent over to our office after fetal heart tones were not heard on Doppler at your office. Patient does not give history of vaginal bleeding.  She complained of decreased fetal movements this morning. Obstetrical history significant for 2 term cesarean deliveries.   She reports no chronic medical conditions. Ultrasound A limited ultrasound study was performed.  Unfortunately, no fetal heart activity seen.  Fetal biometry is consistent with [redacted] weeks gestation. Impression: Intrauterine fetal demise.  I counseled the patient with help of Arabic language interpreter present in the room.  I explained the possible causes including fetal chromosomal anomalies.  I discussed the option of amniocentesis.  Alternatively, fetal karyotype and MicroArray can be performed after delivery. Patient opted not to have amniocentesis. Patient had 2 previous cesarean deliveries and uncomplicated pregnancies.  She does not have any chronic medical conditions that could be attributed to fetal demise.  I spoke to her husband on the phone.  Patient would like to go home and be with family.  Discussed with Dr. Donavan Foil, [facility] attending on call. I informed the patient that she should expect a call from our hospital to schedule her induction of labor.  Recommendations: -Misoprostol induction of labor tomorrow -Postnatal fetal karyotype/microarray Kizzie Fantasia).  Thank you for consultation.  If you have any questions or concerns, please contact me the [facility].  Consultation including face-to-face (more than 50%) counseling 30 minutes.

## 2021-01-23 ENCOUNTER — Encounter (HOSPITAL_COMMUNITY): Payer: Self-pay | Admitting: Obstetrics and Gynecology

## 2021-01-23 ENCOUNTER — Inpatient Hospital Stay (HOSPITAL_COMMUNITY)
Admission: AD | Admit: 2021-01-23 | Discharge: 2021-01-24 | DRG: 806 | Disposition: A | Discharge status: Home / Self Care | Payer: Medicaid Other | Attending: Obstetrics & Gynecology | Admitting: Obstetrics & Gynecology

## 2021-01-23 ENCOUNTER — Inpatient Hospital Stay (HOSPITAL_COMMUNITY): Payer: Medicaid Other

## 2021-01-23 DIAGNOSIS — Z20822 Contact with and (suspected) exposure to covid-19: Secondary | ICD-10-CM | POA: Diagnosis present

## 2021-01-23 DIAGNOSIS — O34211 Maternal care for low transverse scar from previous cesarean delivery: Secondary | ICD-10-CM | POA: Diagnosis not present

## 2021-01-23 DIAGNOSIS — Z3A23 23 weeks gestation of pregnancy: Secondary | ICD-10-CM

## 2021-01-23 DIAGNOSIS — O34219 Maternal care for unspecified type scar from previous cesarean delivery: Secondary | ICD-10-CM | POA: Diagnosis present

## 2021-01-23 DIAGNOSIS — O364XX1 Maternal care for intrauterine death, fetus 1: Secondary | ICD-10-CM | POA: Diagnosis not present

## 2021-01-23 DIAGNOSIS — O364XX0 Maternal care for intrauterine death, not applicable or unspecified: Secondary | ICD-10-CM

## 2021-01-23 DIAGNOSIS — Z3400 Encounter for supervision of normal first pregnancy, unspecified trimester: Secondary | ICD-10-CM

## 2021-01-23 HISTORY — DX: Other specified health status: Z78.9

## 2021-01-23 HISTORY — DX: Maternal care for intrauterine death, not applicable or unspecified: O36.4XX0

## 2021-01-23 LAB — TYPE AND SCREEN
ABO/RH(D): O POS
Antibody Screen: NEGATIVE

## 2021-01-23 LAB — CBC
HCT: 36.6 % (ref 36.0–46.0)
Hemoglobin: 12.1 g/dL (ref 12.0–15.0)
MCH: 30.9 pg (ref 26.0–34.0)
MCHC: 33.1 g/dL (ref 30.0–36.0)
MCV: 93.4 fL (ref 80.0–100.0)
Platelets: 292 10*3/uL (ref 150–400)
RBC: 3.92 MIL/uL (ref 3.87–5.11)
RDW: 12.4 % (ref 11.5–15.5)
WBC: 6.6 10*3/uL (ref 4.0–10.5)
nRBC: 0 % (ref 0.0–0.2)

## 2021-01-23 MED ORDER — MISOPROSTOL 200 MCG PO TABS
ORAL_TABLET | ORAL | Status: AC
  Administered 2021-01-23: 400 ug via ORAL
  Filled 2021-01-23: qty 2

## 2021-01-23 MED ORDER — LIDOCAINE HCL (PF) 1 % IJ SOLN: 30.0000 mL | INTRAMUSCULAR | Status: DC | PRN

## 2021-01-23 MED ORDER — ACETAMINOPHEN 325 MG PO TABS
650.0000 mg | ORAL_TABLET | ORAL | Status: DC | PRN
  Filled 2021-01-23: qty 2

## 2021-01-23 MED ORDER — OXYCODONE-ACETAMINOPHEN 5-325 MG PO TABS
2.0000 | ORAL_TABLET | ORAL | Status: DC | PRN
Start: 2021-01-23 — End: 2021-01-24

## 2021-01-23 MED ORDER — OXYCODONE-ACETAMINOPHEN 5-325 MG PO TABS: 1.0000 | ORAL_TABLET | ORAL | Status: DC | PRN

## 2021-01-23 MED ORDER — FENTANYL CITRATE (PF) 100 MCG/2ML IJ SOLN
100.0000 ug | INTRAMUSCULAR | Status: DC | PRN
  Administered 2021-01-23 (×2): 100 ug via INTRAVENOUS
  Filled 2021-01-23 (×2): qty 2

## 2021-01-23 MED ORDER — ONDANSETRON HCL 4 MG/2ML IJ SOLN
4.0000 mg | Freq: Four times a day (QID) | INTRAMUSCULAR | Status: DC | PRN
  Administered 2021-01-23 – 2021-01-24 (×2): 4 mg via INTRAVENOUS
  Filled 2021-01-23 (×2): qty 2

## 2021-01-23 MED ORDER — MISOPROSTOL 200 MCG PO TABS
400.0000 ug | ORAL_TABLET | ORAL | Status: DC
  Administered 2021-01-23 (×2): 400 ug via ORAL
  Filled 2021-01-23 (×2): qty 2

## 2021-01-23 MED ORDER — LACTATED RINGERS IV SOLN
500.0000 mL | INTRAVENOUS | Status: DC | PRN
  Administered 2021-01-24: 1000 mL via INTRAVENOUS

## 2021-01-23 MED ORDER — OXYTOCIN BOLUS FROM INFUSION
333.0000 mL | Freq: Once | INTRAVENOUS | Status: AC
  Administered 2021-01-24 (×2): 333 mL via INTRAVENOUS

## 2021-01-23 MED ORDER — SOD CITRATE-CITRIC ACID 500-334 MG/5ML PO SOLN: 30.0000 mL | ORAL | Status: DC | PRN

## 2021-01-23 MED ORDER — OXYTOCIN-SODIUM CHLORIDE 30-0.9 UT/500ML-% IV SOLN
2.5000 [IU]/h | INTRAVENOUS | Status: DC
  Administered 2021-01-24 (×2): 2.5 [IU]/h via INTRAVENOUS
  Filled 2021-01-23 (×2): qty 500

## 2021-01-23 MED ORDER — LACTATED RINGERS IV SOLN: INTRAVENOUS | Status: DC

## 2021-01-23 NOTE — Progress Notes (Signed)
Paula Shea is a 35 y.o. G3P2002 at [redacted]w[redacted]d by LMP admitted for induction of labor due to IUFD.  Subjective: Called by RN to check cervix. Time for next Cytotec placement. Pain management with IV Fentanyl. Patient nauseated.  Objective: BP 117/73   Pulse 70   Temp 98.6 F (37 C) (Oral)   Resp 18   Ht 5\' 4"  (1.626 m)   Wt 76.5 kg   LMP 08/14/2020   SpO2 100%   BMI 28.94 kg/m  No intake/output data recorded. No intake/output data recorded.  FHT:  N/A UC:   irregular, every 1-4 minutes SVE:   Dilation: 1.5 Effacement (%): 60 Presentation: Undeterminable Station: Ballotable Exam by:: [email or web address], CNM  Labs: Lab Results  Component Value Date   WBC 6.6 01/23/2021   HGB 12.1 01/23/2021   HCT 36.6 01/23/2021   MCV 93.4 01/23/2021   PLT 292 01/23/2021    Assessment / Plan: Induction of labor due to fetal demise,  progressing well on cytotec  Labor: Progressing normally Preeclampsia:   n/a Fetal Wellbeing:   n/a Pain Control:  IV pain meds I/D:  n/a Anticipated MOD:  NSVD  01/25/2021, CNM 01/23/2021, 10:04 PM

## 2021-01-23 NOTE — Progress Notes (Signed)
13:08 Sharen Counter, CNM, spent an extensive amount of time at the patient's bedside with the interpreter iPad, explaining in detail the typical process and why.    I requested that the patient teach back what she understood about the process that was just explained and she was unable.  I spent an additional 45 minutes explaining the IUFD induction process and allowing time for any questions that she and her partner may have.   Patient is a previous c-section x 2 and will be receiving misoprostol for induction based on IUFD and preterm status.  Dr. Alysia Penna, midwife, and patient all in agreement on plan of care.

## 2021-01-23 NOTE — Progress Notes (Signed)
Paula Shea is a 35 y.o. G3P2002 at [redacted]w[redacted]d admitted for induction of labor due to IUFD.  Subjective: Starting to feel some cramping and back pain. She still does not want an epidural yet until she feels more pain. She will ask for pain medicine when she feels its needed  Objective: BP 116/72   Pulse 70   Ht 5\' 4"  (1.626 m)   Wt 76.5 kg   LMP 08/14/2020   BMI 28.94 kg/m  No intake/output data recorded. No intake/output data recorded.  FHT:  NA UC:   none SVE:   Dilation: 1 Effacement (%): 20 Exam by:: Mary [email or web address] Johnson, RN  Labs: Lab Results  Component Value Date   WBC 6.6 01/23/2021   HGB 12.1 01/23/2021   HCT 36.6 01/23/2021   MCV 93.4 01/23/2021   PLT 292 01/23/2021    Assessment / Plan: Induction of labor due to IUFD, progressing via cytotec  Labor:  Cytotec 01/25/2021 q4hr, making adequate change Fetal Wellbeing:   NA Pain Control:   Epidural and IV pain medication as desired I/D:  n/a Anticipated MOD:  NSVD  MD 01/23/2021, 7:31 PM

## 2021-01-23 NOTE — H&P (Signed)
OBSTETRIC ADMISSION HISTORY AND PHYSICAL  Paula Shea is a 35 y.o. female G3P2002 with IUP at 38w1dby LMP presenting for IOL for IUFD, diagnosed by [place] 01/21/21. Pregnancy has been uncomplicated prior to this diagnosis with history of C/S x 2 for prior deliveries.  She denies painful contractions, LOF, VB, blurry vision, headaches, peripheral edema, or RUQ pain.  She is undecided about contraception. [facility]    Dating: By LMP --->  Estimated Date of Delivery: 05/21/21  Sono:    _0 , no fetal heart rate detected, size CWD, normal anatomy, transverse position head to maternal left,  516g, 29% EFW   Prenatal History/Complications: none, Hx C/S x 2  Past Medical History: Past Medical History:  Diagnosis Date   Medical history non-contributory     Past Surgical History: Past Surgical History:  Procedure Laterality Date   CESAREAN SECTION     2x    Obstetrical History: OB History     Gravida  3   Para  2   Term  2   Preterm  0   AB  0   Living  2      SAB  0   IAB  0   Ectopic  0   Multiple  0   Live Births  2           Social History Social History   Socioeconomic History   Marital status: Married    Spouse name: Not on file   Number of children: Not on file   Years of education: Not on file   Highest education level: Not on file  Occupational History   Not on file  Tobacco Use   Smoking status: Never   Smokeless tobacco: Never  Substance and Sexual Activity   Alcohol use: Never   Drug use: Never   Sexual activity: Yes    Birth control/protection: None  Other Topics Concern   Not on file  Social History Narrative   Not on file   Social Determinants of Health   Financial Resource Strain: Not on file  Food Insecurity: Food Insecurity Present   Worried About Running Out of Food in the Last Year: Sometimes true   Ran Out of Food in the Last Year: Sometimes true  Transportation Needs: Unmet  Transportation Needs   Lack of Transportation (Medical): Yes   Lack of Transportation (Non-Medical): Yes  Physical Activity: Not on file  Stress: Not on file  Social Connections: Not on file    Family History: History reviewed. No pertinent family history.  Allergies: No Known Allergies  Medications Prior to Admission  Medication Sig Dispense Refill Last Dose   acetaminophen (TYLENOL) 325 MG tablet Take 2 tablets (650 mg total) by mouth every 6 (six) hours as needed for up to 30 doses (Pain). 30 tablet 0    Blood Pressure Monitoring (BLOOD PRESSURE KIT) DEVI 1 Device by Does not apply route as needed. 1 each 0    clotrimazole (LOTRIMIN) 1 % cream Apply to affected area 2 times daily (Patient not taking: No sig reported) 15 g 0    nitrofurantoin, macrocrystal-monohydrate, (MACROBID) 100 MG capsule Take 1 capsule (100 mg total) by mouth 2 (two) times daily. (Patient not taking: No sig reported) 10 capsule 0    ondansetron (ZOFRAN) 4 MG tablet Take 4 mg by mouth every 8 (eight) hours as needed for nausea or vomiting. (Patient not taking: No sig reported)  Prenatal Vit-Fe Fumarate-FA (PRENATAL COMPLETE) 14-0.4 MG TABS Take 1 tablet by mouth daily. 60 tablet 3      Review of Systems   All systems reviewed and negative except as stated in HPI  Blood pressure 116/72, pulse 70, height _0  (1.626 m), weight 76.5 kg, last menstrual period 08/14/2020. General appearance: alert, cooperative, and no distress Lungs: clear to auscultation bilaterally Heart: regular rate and rhythm Abdomen: soft, non-tender; bowel sounds normal Pelvic: Cervix 0/50/ballotable  Extremities: Homans sign is negative, no sign of DVT DTR's wnl  Dilation: Closed Exam by:: Fatima Blank CNM   Prenatal labs: ABO, Rh: --/--/O POS (07/23 1202) Antibody: NEG (07/23 1202) Rubella: 2.86 (06/23 1629) RPR: Non Reactive (06/23 1629)  HBsAg: Negative (06/23 1629)  HIV: Non Reactive (06/23 1629)  GBS:    n/a 1 hr Glucola n/a Genetic screening  Normal NIPS Anatomy US, n/a   Results for orders placed or performed during the hospital encounter of 01/23/21 (from the past 24 hour(s))  CBC   Collection Time: 01/23/21 12:02 PM  Result Value Ref Range   WBC 6.6 4.0 - 10.5 K/uL   RBC 3.92 3.87 - 5.11 MIL/uL   Hemoglobin 12.1 12.0 - 15.0 g/dL   HCT 36.6 36.0 - 46.0 %   MCV 93.4 80.0 - 100.0 fL   MCH 30.9 26.0 - 34.0 pg   MCHC 33.1 30.0 - 36.0 g/dL   RDW 12.4 11.5 - 15.5 %   Platelets 292 150 - 400 K/uL   nRBC 0.0 0.0 - 0.2 %  Type and screen Damar   Collection Time: 01/23/21 12:02 PM  Result Value Ref Range   ABO/RH(D) O POS    Antibody Screen NEG    Sample Expiration      01/26/2021,2359 Performed at [facility], [place] [address], [place], [place] [address]     Patient Active Problem List   Diagnosis Date Noted   Fetal demise, greater than 22 weeks, antepartum 01/23/2021   Ultrasound scan done for inability to hear fetal heart tones 01/21/2021   H/O cesarean section complicating pregnancy 12/45/8099   Supervision of normal first pregnancy, antepartum 12/14/2020    Assessment/Plan:  Paula Shea is a 35 y.o. G3P2002 at 9w1dhere for IOL for IUFD  #Labor:IOL for IUFD #Pain: none #ID:  N/a #MOC:undecided   LFatima Blank CNM  01/23/2021, 2:58 PM

## 2021-01-24 ENCOUNTER — Other Ambulatory Visit: Payer: Self-pay

## 2021-01-24 ENCOUNTER — Inpatient Hospital Stay (HOSPITAL_COMMUNITY): Payer: Medicaid Other | Admitting: Anesthesiology

## 2021-01-24 ENCOUNTER — Encounter (HOSPITAL_COMMUNITY): Payer: Self-pay

## 2021-01-24 DIAGNOSIS — O364XX0 Maternal care for intrauterine death, not applicable or unspecified: Secondary | ICD-10-CM

## 2021-01-24 DIAGNOSIS — O364XX1 Maternal care for intrauterine death, fetus 1: Secondary | ICD-10-CM

## 2021-01-24 DIAGNOSIS — Z3A23 23 weeks gestation of pregnancy: Secondary | ICD-10-CM

## 2021-01-24 DIAGNOSIS — O34211 Maternal care for low transverse scar from previous cesarean delivery: Secondary | ICD-10-CM

## 2021-01-24 LAB — CBC
HCT: 25.8 % — ABNORMAL LOW (ref 36.0–46.0)
Hemoglobin: 8.9 g/dL — ABNORMAL LOW (ref 12.0–15.0)
MCH: 31.7 pg (ref 26.0–34.0)
MCHC: 34.5 g/dL (ref 30.0–36.0)
MCV: 91.8 fL (ref 80.0–100.0)
Platelets: 273 10*3/uL (ref 150–400)
RBC: 2.81 MIL/uL — ABNORMAL LOW (ref 3.87–5.11)
RDW: 12.1 % (ref 11.5–15.5)
WBC: 13.3 10*3/uL — ABNORMAL HIGH (ref 4.0–10.5)
nRBC: 0 % (ref 0.0–0.2)

## 2021-01-24 LAB — RESP PANEL BY RT-PCR (FLU A&B, COVID) ARPGX2
Influenza A by PCR: NEGATIVE
Influenza B by PCR: NEGATIVE
SARS Coronavirus 2 by RT PCR: NEGATIVE

## 2021-01-24 LAB — RPR: RPR Ser Ql: NONREACTIVE

## 2021-01-24 MED ORDER — TRANEXAMIC ACID-NACL 1000-0.7 MG/100ML-% IV SOLN
1000.0000 mg | Freq: Once | INTRAVENOUS | Status: AC
  Administered 2021-01-24: 1000 mg via INTRAVENOUS

## 2021-01-24 MED ORDER — BENZOCAINE-MENTHOL 20-0.5 % EX AERO: 1.0000 "application " | INHALATION_SPRAY | CUTANEOUS | Status: DC | PRN

## 2021-01-24 MED ORDER — FENTANYL-BUPIVACAINE-NACL 0.5-0.125-0.9 MG/250ML-% EP SOLN
12.0000 mL/h | EPIDURAL | Status: DC | PRN
  Administered 2021-01-24: 12 mL/h via EPIDURAL
  Filled 2021-01-24: qty 250

## 2021-01-24 MED ORDER — TETANUS-DIPHTH-ACELL PERTUSSIS 5-2.5-18.5 LF-MCG/0.5 IM SUSY: 0.5000 mL | PREFILLED_SYRINGE | Freq: Once | INTRAMUSCULAR | Status: DC

## 2021-01-24 MED ORDER — METHYLERGONOVINE MALEATE 0.2 MG/ML IJ SOLN: 0.2000 mg | INTRAMUSCULAR | Status: DC | PRN

## 2021-01-24 MED ORDER — DIBUCAINE (PERIANAL) 1 % EX OINT
1.0000 | TOPICAL_OINTMENT | CUTANEOUS | Status: DC | PRN
Start: 2021-01-24 — End: 2021-01-24

## 2021-01-24 MED ORDER — DIPHENHYDRAMINE HCL 50 MG/ML IJ SOLN: 12.5000 mg | INTRAMUSCULAR | Status: DC | PRN

## 2021-01-24 MED ORDER — PHENYLEPHRINE 40 MCG/ML (10ML) SYRINGE FOR IV PUSH (FOR BLOOD PRESSURE SUPPORT): 80.0000 ug | PREFILLED_SYRINGE | INTRAVENOUS | Status: DC | PRN

## 2021-01-24 MED ORDER — IBUPROFEN 600 MG PO TABS
600.0000 mg | ORAL_TABLET | Freq: Four times a day (QID) | ORAL | 0 refills | Status: DC
Start: 2021-01-24 — End: 2021-02-19

## 2021-01-24 MED ORDER — DIPHENHYDRAMINE HCL 25 MG PO CAPS: 25.0000 mg | ORAL_CAPSULE | Freq: Four times a day (QID) | ORAL | Status: DC | PRN

## 2021-01-24 MED ORDER — LACTATED RINGERS IV SOLN
500.0000 mL | Freq: Once | INTRAVENOUS | Status: AC
  Administered 2021-01-24: 500 mL via INTRAVENOUS

## 2021-01-24 MED ORDER — ONDANSETRON HCL 4 MG/2ML IJ SOLN: 4.0000 mg | INTRAMUSCULAR | Status: DC | PRN

## 2021-01-24 MED ORDER — WITCH HAZEL-GLYCERIN EX PADS
1.0000 | MEDICATED_PAD | CUTANEOUS | Status: DC | PRN
Start: 2021-01-24 — End: 2021-01-24

## 2021-01-24 MED ORDER — SIMETHICONE 80 MG PO CHEW: 80.0000 mg | CHEWABLE_TABLET | ORAL | Status: DC | PRN

## 2021-01-24 MED ORDER — EPHEDRINE 5 MG/ML INJ: 10.0000 mg | INTRAVENOUS | Status: DC | PRN

## 2021-01-24 MED ORDER — ONDANSETRON HCL 4 MG PO TABS: 4.0000 mg | ORAL_TABLET | ORAL | Status: DC | PRN

## 2021-01-24 MED ORDER — PRENATAL MULTIVITAMIN CH: 1.0000 | ORAL_TABLET | Freq: Every day | ORAL | Status: DC

## 2021-01-24 MED ORDER — LIDOCAINE HCL (PF) 1 % IJ SOLN
INTRAMUSCULAR | Status: DC | PRN
  Administered 2021-01-24: 8 mL via EPIDURAL

## 2021-01-24 MED ORDER — MISOPROSTOL 200 MCG PO TABS
ORAL_TABLET | ORAL | Status: AC
  Filled 2021-01-24: qty 4

## 2021-01-24 MED ORDER — TRANEXAMIC ACID-NACL 1000-0.7 MG/100ML-% IV SOLN
INTRAVENOUS | Status: AC
  Filled 2021-01-24: qty 100

## 2021-01-24 MED ORDER — ACETAMINOPHEN 325 MG PO TABS: 650.0000 mg | ORAL_TABLET | ORAL | Status: DC | PRN

## 2021-01-24 MED ORDER — MISOPROSTOL 200 MCG PO TABS
800.0000 ug | ORAL_TABLET | Freq: Once | ORAL | Status: AC
  Administered 2021-01-24: 800 ug via RECTAL

## 2021-01-24 MED ORDER — ZOLPIDEM TARTRATE 5 MG PO TABS: 5.0000 mg | ORAL_TABLET | Freq: Every evening | ORAL | Status: DC | PRN

## 2021-01-24 MED ORDER — METHYLERGONOVINE MALEATE 0.2 MG/ML IJ SOLN
INTRAMUSCULAR | Status: AC
  Filled 2021-01-24: qty 1

## 2021-01-24 MED ORDER — IBUPROFEN 600 MG PO TABS
600.0000 mg | ORAL_TABLET | Freq: Four times a day (QID) | ORAL | Status: DC
  Administered 2021-01-24 (×2): 600 mg via ORAL
  Filled 2021-01-24 (×2): qty 1

## 2021-01-24 MED ORDER — METHYLERGONOVINE MALEATE 0.2 MG/ML IJ SOLN
0.2000 mg | Freq: Once | INTRAMUSCULAR | Status: AC
  Administered 2021-01-24: 0.2 mg via INTRAMUSCULAR

## 2021-01-24 MED ORDER — COCONUT OIL OIL: 1.0000 "application " | TOPICAL_OIL | Status: DC | PRN

## 2021-01-24 MED ORDER — SENNOSIDES-DOCUSATE SODIUM 8.6-50 MG PO TABS: 2.0000 | ORAL_TABLET | Freq: Every day | ORAL | Status: DC

## 2021-01-24 MED ORDER — METHYLERGONOVINE MALEATE 0.2 MG PO TABS: 0.2000 mg | ORAL_TABLET | ORAL | Status: DC | PRN

## 2021-01-24 NOTE — Anesthesia Postprocedure Evaluation (Signed)
Anesthesia Post Note  Patient: Paula Shea  Procedure(s) Performed: AN AD HOC LABOR EPIDURAL     Patient location during evaluation: OB High Risk Anesthesia Type: Epidural Level of consciousness: awake, oriented and awake and alert Pain management: pain level controlled Vital Signs Assessment: post-procedure vital signs reviewed and stable Respiratory status: spontaneous breathing, respiratory function stable and nonlabored ventilation Cardiovascular status: stable Postop Assessment: adequate PO intake, able to ambulate, patient able to bend at knees and no apparent nausea or vomiting Anesthetic complications: no   No notable events documented.  Last Vitals:  Vitals:   01/24/21 0730 01/24/21 1133  BP: 100/62 114/67  Pulse: 74 79  Resp: 18 18  Temp: 36.6 C 36.7 C  SpO2: 100% 100%    Last Pain:  Vitals:   01/24/21 1133  TempSrc: Oral  PainSc:    Pain Goal: Patients Stated Pain Goal: 3 (01/24/21 0909)                 Asier Desroches

## 2021-01-24 NOTE — Anesthesia Procedure Notes (Signed)
Epidural Patient location during procedure: OB Start time: 01/24/2021 12:55 AM End time: 01/24/2021 1:05 AM  Staffing Anesthesiologist: Mellody Dance, MD Performed: anesthesiologist   Preanesthetic Checklist Completed: patient identified, IV checked, site marked, risks and benefits discussed, monitors and equipment checked, pre-op evaluation and timeout performed  Epidural Patient position: sitting Prep: DuraPrep Patient monitoring: heart rate, cardiac monitor, continuous pulse ox and blood pressure Approach: midline Location: L3-L4 Injection technique: LOR saline  Needle:  Needle type: Tuohy  Needle gauge: 17 G Needle length: 9 cm Needle insertion depth: 5 cm Catheter type: closed end flexible Catheter size: 20 Guage Catheter at skin depth: 10 cm Test dose: negative and Other  Assessment Events: blood not aspirated, injection not painful, no injection resistance and negative IV test  Additional Notes Informed consent obtained prior to proceeding including risk of failure, 1% risk of PDPH, risk of minor discomfort and bruising.  Discussed rare but serious complications including epidural abscess, permanent nerve injury, epidural hematoma.  Discussed alternatives to epidural analgesia and patient desires to proceed.  Timeout performed pre-procedure verifying patient name, procedure, and platelet count.  Patient tolerated procedure well.

## 2021-01-24 NOTE — Discharge Summary (Signed)
Postpartum Discharge Summary  Date of Service updated7/24/22     Patient Name: Paula Shea DOB: 05-20-1986 MRN: [id number]  Date of admission: 01/23/2021 Delivery date:01/24/2021  Delivering provider: Laury Deep  Date of discharge: 01/24/2021  Admitting diagnosis: Fetal demise affecting delivery [O36.4XX0] Intrauterine pregnancy: [redacted]w[redacted]d    Secondary diagnosis:  Active Problems:   Fetal demise, greater than 22 weeks, antepartum  Additional problems: PPH    Discharge diagnosis: VBAC                                              Post partum procedures: Manual extraction of placenta Augmentation: AROM and Cytotec Complications: [id number] In[facility] of Labor With Vaginal Delivery   35y.o. yo G3P2002 at 33w2das admitted to the hospital 01/23/2021 for induction of labor.  Indication for induction:  Fetal Demise .  Patient had an uncomplicated labor course as follows: Membrane Rupture Time/Date: 2:11 AM ,01/24/2021   Delivery Method:Vaginal, Spontaneous  Episiotomy: None  Lacerations:  None  Details of delivery can be found in separate delivery note.  Patient had a routine postpartum course. Patient is discharged home 01/24/21.  Newborn Data: Birth date:01/24/2021  Birth time:2:28 AM  Gender:Female  Living status:Fetal Demise  Apgars:0 ,0  Weight:459 g   Magnesium Sulfate received: No BMZ received: No Rhophylac:N/A MMR:N/A T-DaP: no Flu: No Transfusion:No  Physical exam  Vitals:   01/24/21 0601 01/24/21 0630 01/24/21 0730 01/24/21 1133  BP: (!) 110/59 (!) 107/59 100/62 114/67  Pulse: 75  74 79  Resp:  _0 Temp:  (!) 97.5 F (36.4 C) 97.8 F (36.6 C) 98 F (36.7 C)  TempSrc:  Oral Oral Oral  SpO2:  99% 100% 100%  Weight:   76.5 kg   Height:   _1  (1.626 m)    General: alert Lochia: appropriate Uterine Fundus: firm Incision: N/A DVT Evaluation: No evidence of DVT seen on physical exam. Labs: Lab Results   Component Value Date   WBC 13.3 (H) 01/24/2021   HGB 8.9 (L) 01/24/2021   HCT 25.8 (L) 01/24/2021   MCV 91.8 01/24/2021   PLT 273 01/24/2021   No flowsheet data found. Edinburgh Score: No flowsheet data found.   After visit meds:  Allergies as of 01/24/2021   No Known Allergies      Medication List     STOP taking these medications    acetaminophen 325 MG tablet Commonly known as: Tylenol   Blood Pressure Kit Devi   clotrimazole 1 % cream Commonly known as: LOTRIMIN   nitrofurantoin (macrocrystal-monohydrate) 100 MG capsule Commonly known as: MACROBID   ondansetron 4 MG tablet Commonly known as: ZOFRAN       TAKE these medications    ibuprofen 600 MG tablet Commonly known as: ADVIL Take 1 tablet (600 mg total) by mouth every 6 (six) hours.   Prenatal Complete 14-0.4 MG Tabs Take 1 tablet by mouth daily.         Discharge home in stable condition Infant Feeding:  n/a Infant Disposition: Patient desires to take demised fetus home Discharge instruction: per After Visit Summary and Postpartum booklet. Activity: Advance as tolerated. Pelvic rest for 6 weeks.  Diet: routine diet Future Appointments:No future appointments. [facility]. Schedule an appointment as soon as possible for a visit.   Specialty: Obstetrics and Gynecology Why: postpartum visit Contact information: [place] [phone] [place] for [employer] at [facility] Follow up in 4 week(s).   Specialty: Obstetrics and Gynecology Contact information: [place] [address] [phone]                Message sent to [facility] @ 0600 on 01/24/2021 by Laury Deep, CNM Please schedule this patient for a In person postpartum visit in  4 weeks  with the following provider:  MD. Additional Postpartum F/U:Postpartum Depression checkup  High risk pregnancy complicated by:  Preterm IUFD Delivery mode:  Vaginal, Spontaneous  Anticipated Birth Control:  Unsure   01/24/2021 Chancy Milroy, MD

## 2021-01-24 NOTE — Anesthesia Preprocedure Evaluation (Signed)
Anesthesia Evaluation  Patient identified by MRN, date of birth, ID band Patient awake    Reviewed: Allergy & Precautions, NPO status , Patient's Chart, lab work & pertinent test results  Airway Mallampati: II  TM Distance: >3 FB Neck ROM: Full    Dental no notable dental hx.    Pulmonary neg pulmonary ROS,    Pulmonary exam normal breath sounds clear to auscultation       Cardiovascular negative cardio ROS Normal cardiovascular exam Rhythm:Regular Rate:Normal     Neuro/Psych negative neurological ROS  negative psych ROS   GI/Hepatic negative GI ROS, Neg liver ROS,   Endo/Other  negative endocrine ROS  Renal/GU negative Renal ROS  negative genitourinary   Musculoskeletal negative musculoskeletal ROS (+)   Abdominal   Peds negative pediatric ROS (+)  Hematology negative hematology ROS (+)   Anesthesia Other Findings   Reproductive/Obstetrics (+) Pregnancy IUFD                             Anesthesia Physical Anesthesia Plan  ASA: 2  Anesthesia Plan: Epidural   Post-op Pain Management:    Induction:   PONV Risk Score and Plan: 2 and Treatment may vary due to age or medical condition  Airway Management Planned: Natural Airway  Additional Equipment:   Intra-op Plan:   Post-operative Plan:   Informed Consent: I have reviewed the patients History and Physical, chart, labs and discussed the procedure including the risks, benefits and alternatives for the proposed anesthesia with the patient or authorized representative who has indicated his/her understanding and acceptance.       Plan Discussed with: Anesthesiologist  Anesthesia Plan Comments:         Anesthesia Quick Evaluation

## 2021-01-24 NOTE — Progress Notes (Signed)
Paula Shea is a 35 y.o. G3P2002 at [redacted]w[redacted]d by LMP admitted for induction of labor due to IUFD.  Subjective: Patient now comfortable with epidural. RN reports cervical exam to be 9 cm with BBOW. Come for AROM and delivery.  Objective: BP 113/60   Pulse 67   Temp 98.2 F (36.8 C) (Oral)   Resp 18   Ht 5\' 4"  (1.626 m)   Wt 76.5 kg   LMP 08/14/2020   SpO2 97%   BMI 28.94 kg/m  No intake/output data recorded. No intake/output data recorded.  FHT:  N/A UC:   regular toco not used d/t fetal demise SVE:   Dilation: 9 Effacement (%): 90 Station: Plus 1 Exam by:: [email or web address], RN AROM with copious amounts of dark, red, bloody fluid in return. Patient tolerated procedure well.   Labs: Lab Results  Component Value Date   WBC 6.6 01/23/2021   HGB 12.1 01/23/2021   HCT 36.6 01/23/2021   MCV 93.4 01/23/2021   PLT 292 01/23/2021    Assessment / Plan: Induction of labor due to fetal demise,  progressing well on pitocin  Labor: Progressing normally Preeclampsia:   n/a Fetal Wellbeing:   n/a Pain Control:  Epidural I/D:  n/a Anticipated MOD:  NSVD  01/25/2021, CNM 01/24/2021, 2:14 AM

## 2021-01-24 NOTE — Plan of Care (Signed)

## 2021-01-24 NOTE — Progress Notes (Signed)
Dari Interpretor used to discuss funeral arrangements. Pt is muslim and has stated a priest coming around 1. Pt states she will notify nurse when husband comes back to hospital to discuss arrangements.

## 2021-01-26 LAB — SURGICAL PATHOLOGY

## 2021-02-04 ENCOUNTER — Telehealth (HOSPITAL_COMMUNITY): Payer: Self-pay | Admitting: *Deleted

## 2021-02-04 NOTE — Telephone Encounter (Signed)
Hospital Discharge Follow-Up Call:  Patient reports that she is doing well and has no concerns about her health.  She says that she carried her baby for 5 months and was far enough along to feel baby moving.  She is with her husband and her two older children and she says she is doing OK with everything.  She thanked Charity fundraiser for checking on her.

## 2021-02-17 ENCOUNTER — Ambulatory Visit (INDEPENDENT_AMBULATORY_CARE_PROVIDER_SITE_OTHER): Payer: Medicaid Other | Admitting: Certified Nurse Midwife

## 2021-02-17 ENCOUNTER — Other Ambulatory Visit: Payer: Self-pay

## 2021-02-17 ENCOUNTER — Encounter: Payer: Self-pay | Admitting: General Practice

## 2021-02-17 DIAGNOSIS — Z8759 Personal history of other complications of pregnancy, childbirth and the puerperium: Secondary | ICD-10-CM

## 2021-02-17 NOTE — Progress Notes (Signed)
Post Partum Visit Note Live Dari interpeter present throughout visit for interpretation services.  Paula Shea is a 35 y.o. G55P2102 female who presents for a postpartum visit. She is 3 weeks postpartum following an IUFD with vaginal delivery at [redacted]w[redacted]d.  I have fully reviewed the prenatal and intrapartum course. Anesthesia: epidural. Postpartum course has been uncomplicated with good support from FOB. Bleeding no bleeding. Bowel function is normal. Bladder function is normal. Patient is not sexually active. Contraception method is none, they desire to get pregnant again soon. Postpartum depression screening: negative.  The pregnancy intention screening data noted above was reviewed. Potential methods of contraception were discussed. The patient elected to proceed with Pregnant/Seeking Pregnancy.   Edinburgh Postnatal Depression Scale - 02/17/21 1503       Edinburgh Postnatal Depression Scale:  In the Past 7 Days   I have been able to laugh and see the funny side of things. 0    I have looked forward with enjoyment to things. 0    I have blamed myself unnecessarily when things went wrong. 0    I have been anxious or worried for no good reason. 0    I have felt scared or panicky for no good reason. 3    Things have been getting on top of me. 0    I have been so unhappy that I have had difficulty sleeping. 0    I have felt sad or miserable. 0    I have been so unhappy that I have been crying. 0    The thought of harming myself has occurred to me. 0    Edinburgh Postnatal Depression Scale Total 3            Health Maintenance Due  Topic Date Due   TETANUS/TDAP  Never done   INFLUENZA VACCINE  02/01/2021   The following portions of the patient's history were reviewed and updated as appropriate: allergies, current medications, past family history, past medical history, past social history, past surgical history, and problem list.  Review of Systems Pertinent items noted in HPI and  remainder of comprehensive ROS otherwise negative.  Objective:  BP 115/77   Pulse 86   Wt 159 lb 12.8 oz (72.5 kg)   LMP 08/14/2020   Breastfeeding No   BMI 27.43 kg/m    General:  alert, cooperative, appears stated age, and no distress   Breasts:  normal  Lungs: Normal effort  Heart:  regular rate and rhythm  Abdomen: Soft, non-tender    Wound N/A (delivered vaginally)  GU exam:   Had a skin tear at the apex of her vulva, which she states has been there since delivery. Had an uncomplicated delivery but a retained placenta and thinks the tear may have happened during fundal pressure and attempted manual removal. Wipes back to front after urinating and has noted a burning sensation when she does so.       Assessment:  Normal postpartum exam after IUFD at [redacted]w[redacted]d  Plan:  Essential components of care per ACOG recommendations:  1.  Mood and well being: Patient with negative depression screening today. Reviewed local resources for support.  - copious reassurance given that pt is not responsible for IUFD. Strongly encouraged her to wait for at least 38mo before trying for pregnancy again, to let her body and mind fully recover from the loss. Emphasized to FOB to use condoms until pt expresses readiness for pregnancy. He verbalized agreement. - Patient tobacco use? No.   -  hx of drug use? No.    2. Infant care and feeding:  -Patient currently breastmilk feeding? No. Breastmilk came in but she was able to express a little bit and is in the process of involution. No s/sx of mastitis or engorgement. -Social determinants of health (SDOH) reviewed in EPIC. No concerns  3. Sexuality, contraception and birth spacing - Patient does want a pregnancy in the next year.  Desired family size is 3 children.  - Reviewed forms of contraception in tiered fashion. Patient desired no method today.   - Discussed birth spacing of 3-5 months with pt and FOB  4. Sleep and fatigue -Encouraged  family/partner/community support of 6-8 hrs of uninterrupted sleep to help with mood and fatigue  5. Physical Recovery  - Discussed patients delivery and complications.  - Patient had a  vaginal delivery with retained placenta that required manual removal . Patient had  no vaginal/perineal  laceration, has the skin tear at the apex of her vulva. Encouraged front to back wiping, avoiding that area altogether, appropriate cleaning technicques, and application of neosporin daily until it heals. Perineal healing reviewed. Patient expressed understanding.  - Patient has urinary incontinence? No. - Patient is safe to resume physical and sexual activity when ready.  6.  Health Maintenance - HM due items addressed Yes - Last pap smear  Diagnosis  Date Value Ref Range Status  12/24/2020   Final   - Negative for intraepithelial lesion or malignancy (NILM)   Pap smear not done at today's visit.  -Breast Cancer screening indicated? No.   7. Chronic Disease/Pregnancy Condition follow up: None - PCP follow up as needed  Bernerd Limbo, CNM [facility] [employer], [facility]

## 2021-02-17 NOTE — Progress Notes (Deleted)
    Post Partum Visit Note  Paula Shea is a 35 y.o. G56P2102 female who presents for a postpartum visit. She is {1-10:13787} {time; units:18646} postpartum following a {method of delivery:313099}.  I have fully reviewed the prenatal and intrapartum course. The delivery was at *** gestational weeks.  Anesthesia: {anesthesia types:812}. Postpartum course has been ***. Baby is doing well***. Baby is feeding by {breast/bottle:69}. Bleeding {vag bleed:12292}. Bowel function is {normal:32111}. Bladder function is {normal:32111}. Patient {is/is not:9024} sexually active. Contraception method is {contraceptive method:5051}. Postpartum depression screening: {gen negative/positive:315881}.   The pregnancy intention screening data noted above was reviewed. Potential methods of contraception were discussed. The patient elected to proceed with No data recorded.    Health Maintenance Due  Topic Date Due   TETANUS/TDAP  Never done   INFLUENZA VACCINE  02/01/2021    {Common ambulatory SmartLinks:19316}  Review of Systems {ros; complete:30496}  Objective:  LMP 08/14/2020    General:  {gen appearance:16600}   Breasts:  {desc; normal/abnormal/not indicated:14647}  Lungs: {lung exam:16931}  Heart:  {heart exam:5510}  Abdomen: {abdomen exam:16834}   Wound {Wound assessment:11097}  GU exam:  {desc; normal/abnormal/not indicated:14647}       Assessment:    There are no diagnoses linked to this encounter.  *** postpartum exam.   Plan:   Essential components of care per ACOG recommendations:  1.  Mood and well being: Patient with {gen negative/positive:315881} depression screening today. Reviewed local resources for support.  - Patient tobacco use? {tobacco use:25506}  - hx of drug use? {yes/no:25505}    2. Infant care and feeding:  -Patient currently breastmilk feeding? {yes/no:25502}  -Social determinants of health (SDOH) reviewed in EPIC. No concerns***The following needs were  identified***  3. Sexuality, contraception and birth spacing - Patient {DOES_DOES BTD:17616} want a pregnancy in the next year.  Desired family size is {NUMBER 1-10:22536} children.  - Reviewed forms of contraception in tiered fashion. Patient desired {PLAN CONTRACEPTION:313102} today.   - Discussed birth spacing of 18 months  4. Sleep and fatigue -Encouraged family/partner/community support of 4 hrs of uninterrupted sleep to help with mood and fatigue  5. Physical Recovery  - Discussed patients delivery and complications. She describes her labor as {description:25511} - Patient had a {CHL AMB DELIVERY:2727547256}. Patient had a {laceration:25518} laceration. Perineal healing reviewed. Patient expressed understanding - Patient has urinary incontinence? {yes/no:25515} - Patient {ACTION; IS/IS WVP:71062694} safe to resume physical and sexual activity  6.  Health Maintenance - HM due items addressed {Yes or If no, why not?:20788} - Last pap smear  Diagnosis  Date Value Ref Range Status  12/24/2020   Final   - Negative for intraepithelial lesion or malignancy (NILM)   Pap smear {done:10129} at today's visit.  -Breast Cancer screening indicated? {indicated:25516}  7. Chronic Disease/Pregnancy Condition follow up: {Follow up:25499}  - PCP follow up  Vidal Schwalbe, CMA [facility] [employer], [facility]

## 2021-02-19 ENCOUNTER — Encounter: Payer: Self-pay | Admitting: Certified Nurse Midwife

## 2021-02-19 DIAGNOSIS — Z8759 Personal history of other complications of pregnancy, childbirth and the puerperium: Secondary | ICD-10-CM | POA: Insufficient documentation

## 2021-02-22 ENCOUNTER — Encounter: Payer: Medicaid Other | Admitting: Nurse Practitioner

## 2021-02-22 ENCOUNTER — Other Ambulatory Visit: Payer: Medicaid Other

## 2021-05-24 ENCOUNTER — Ambulatory Visit: Payer: Medicaid Other

## 2021-05-24 ENCOUNTER — Ambulatory Visit (INDEPENDENT_AMBULATORY_CARE_PROVIDER_SITE_OTHER): Payer: Medicaid Other

## 2021-05-24 ENCOUNTER — Other Ambulatory Visit: Payer: Self-pay

## 2021-05-24 VITALS — BP 123/71 | HR 89 | Wt 163.5 lb

## 2021-05-24 DIAGNOSIS — O3680X0 Pregnancy with inconclusive fetal viability, not applicable or unspecified: Secondary | ICD-10-CM

## 2021-05-24 DIAGNOSIS — Z3201 Encounter for pregnancy test, result positive: Secondary | ICD-10-CM

## 2021-05-24 DIAGNOSIS — Z8759 Personal history of other complications of pregnancy, childbirth and the puerperium: Secondary | ICD-10-CM

## 2021-05-24 DIAGNOSIS — Z5941 Food insecurity: Secondary | ICD-10-CM

## 2021-05-24 LAB — POCT PREGNANCY, URINE: Preg Test, Ur: POSITIVE — AB

## 2021-05-24 NOTE — Progress Notes (Signed)
Pt here today for pregnancy test resulting positive.  With Video Interpreter informed pt results.  Pt reports LMP 03/17/21, EDD 12/22/21, and 9w 5d today.  Pt denies vaginal bleeding or discharge.  Pt reports lower back pain and intermittent lower abdominal pain.  Pt confirms that she had a IUFD in July and requests an U/S.  I explained to the pt that the provider will decide on the need for U/S as it may not be what she would anticipate during her pregnancy.  U/S scheduled on 05/31/21 @ 0900.  Pt notified.   Pt advised that the front office will schedule her NEW OB intake and NEW OB provider appt.  Pt verbalized understanding with no further questions.   Stephaun Million,RN  05/24/21

## 2021-05-31 ENCOUNTER — Telehealth: Payer: Self-pay | Admitting: Medical

## 2021-05-31 ENCOUNTER — Encounter: Payer: Self-pay | Admitting: Medical

## 2021-05-31 ENCOUNTER — Other Ambulatory Visit: Payer: Self-pay

## 2021-05-31 ENCOUNTER — Ambulatory Visit
Admission: RE | Admit: 2021-05-31 | Discharge: 2021-05-31 | Disposition: A | Discharge status: Home / Self Care | Payer: Medicaid Other | Source: Ambulatory Visit | Attending: Family Medicine | Admitting: Family Medicine

## 2021-05-31 ENCOUNTER — Other Ambulatory Visit: Payer: Self-pay | Admitting: Family Medicine

## 2021-05-31 DIAGNOSIS — Z8759 Personal history of other complications of pregnancy, childbirth and the puerperium: Secondary | ICD-10-CM | POA: Insufficient documentation

## 2021-05-31 DIAGNOSIS — O3680X0 Pregnancy with inconclusive fetal viability, not applicable or unspecified: Secondary | ICD-10-CM

## 2021-05-31 IMAGING — US US OB COMP LESS 14 WK
1 series · 15 of 28 positions shown · non-contrast
Comparison: None.

CLINICAL DATA: Confirm dating

EXAM:
OBSTETRIC <14 WK ULTRASOUND
TECHNIQUE: Transabdominal ultrasound was performed for evaluation of the
gestation as well as the maternal uterus and adnexal regions.

[Series 1: us ob comp less 14 wk · 15 of 60 slices shown]
[im 1/60]
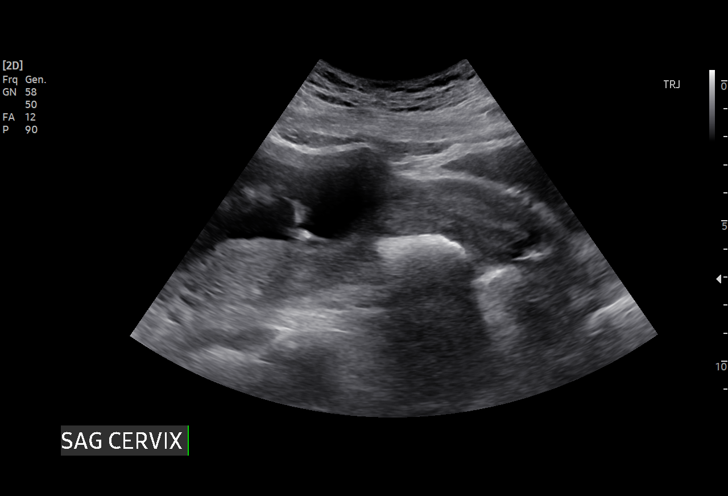
[im 5/60]
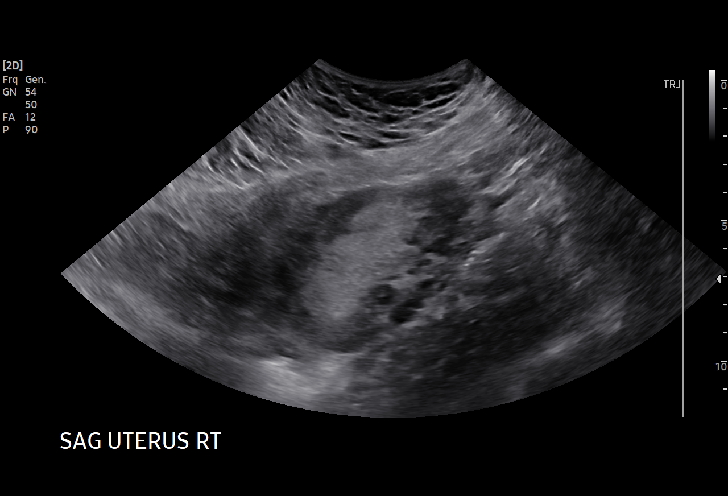
[im 9/60]
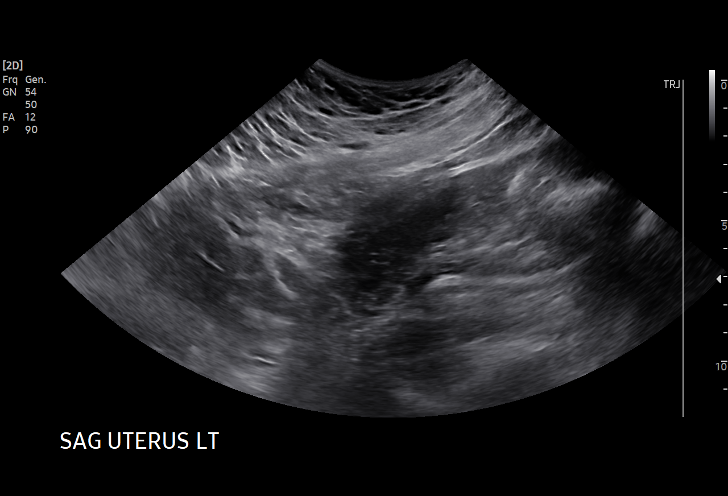
[im 14/60]
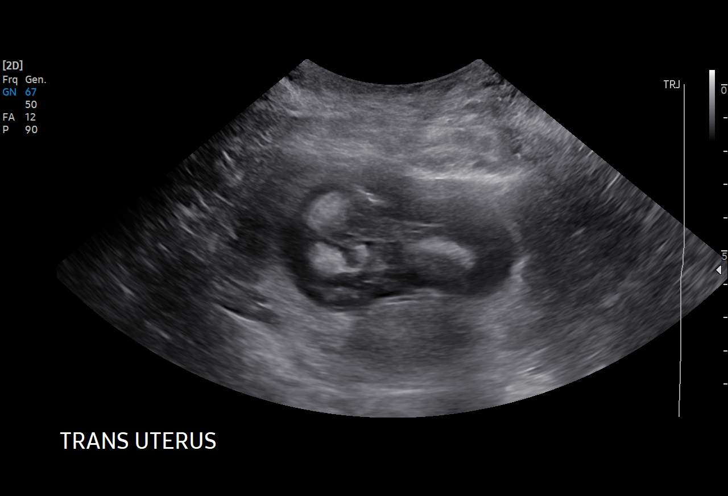
[im 18/60]
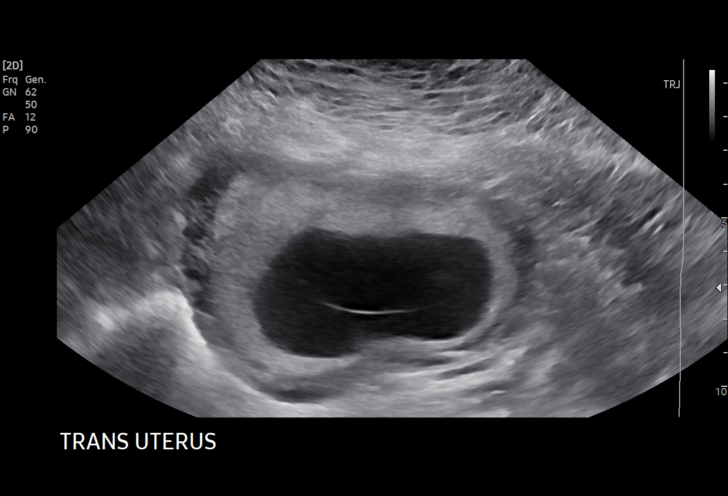
[im 22/60]
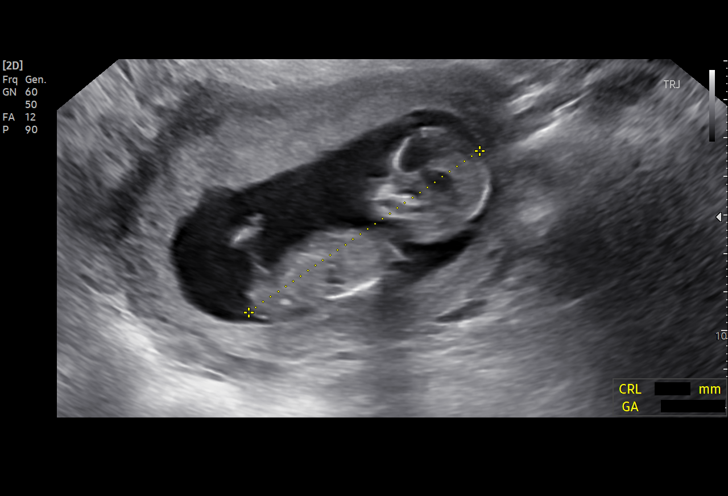
[im 27/60]
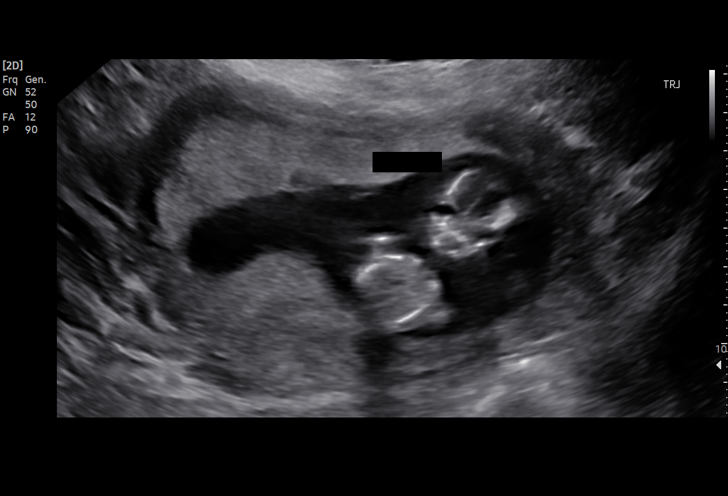
[im 31/60]
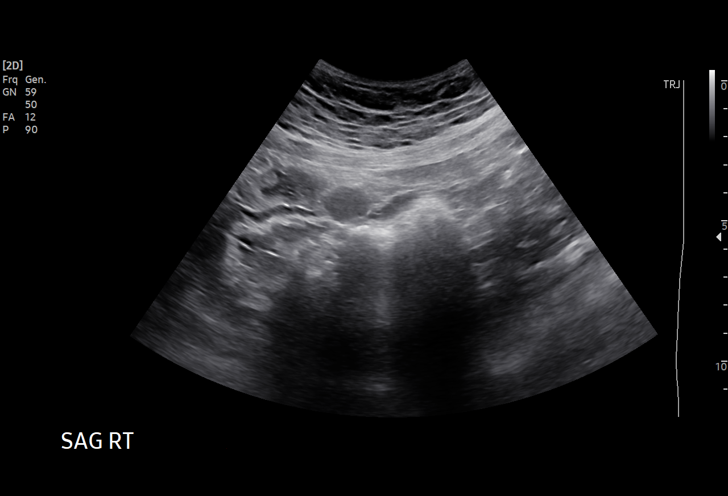
[im 33/60]
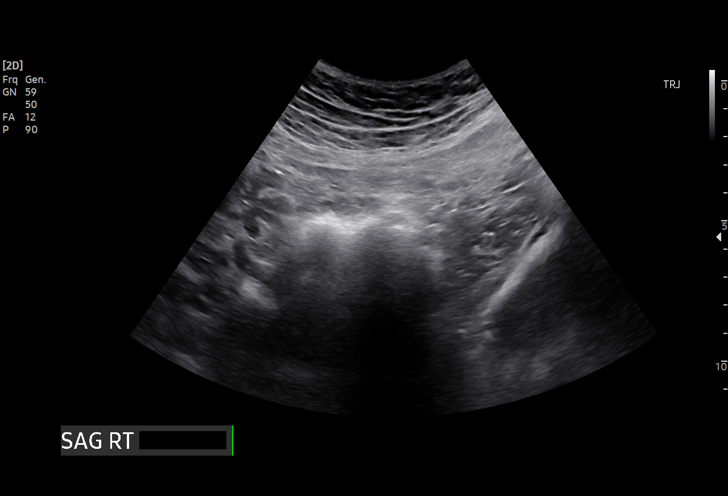
[im 38/60]
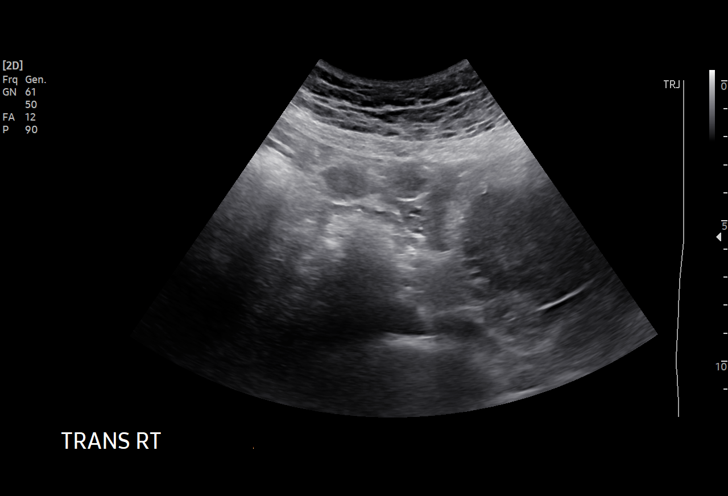
[im 42/60]
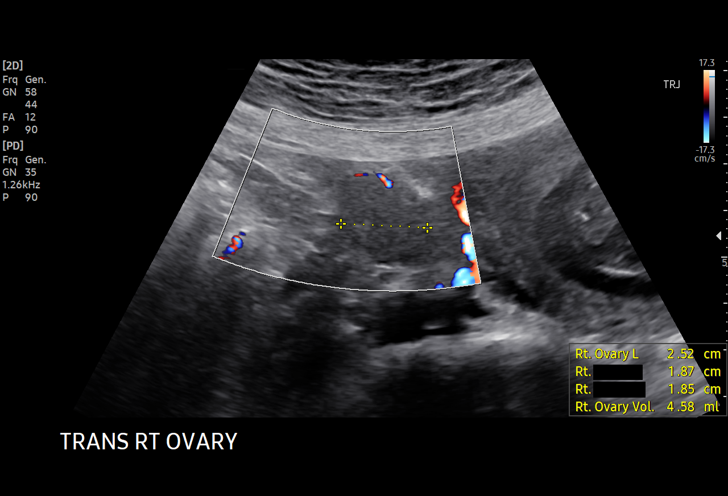
[im 46/60]
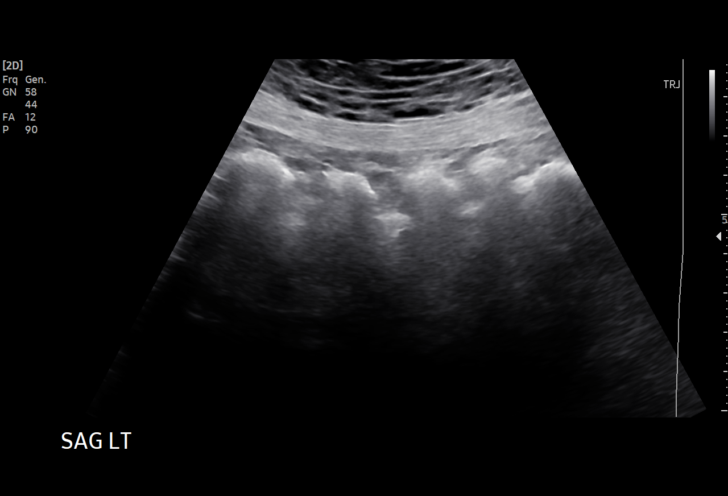
[im 51/60]
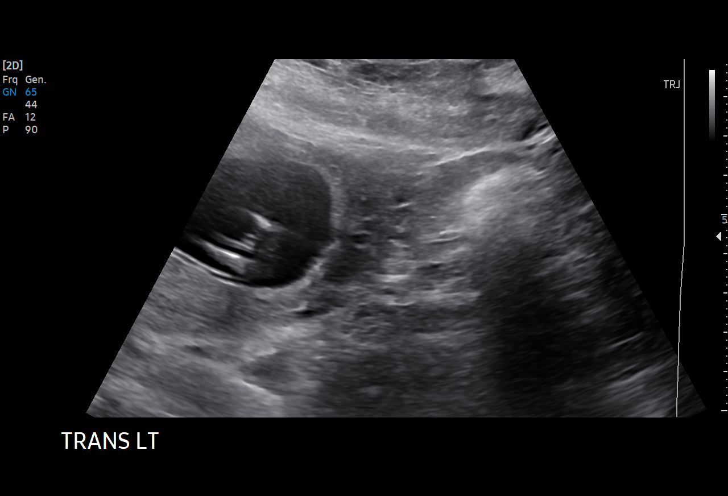
[im 55/60]
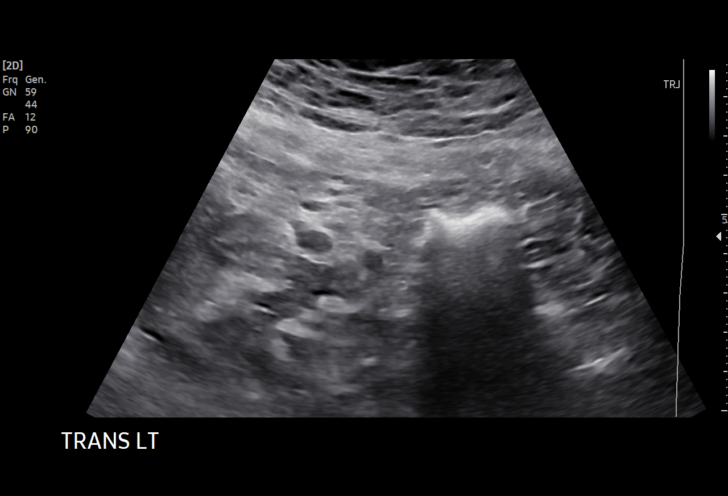
[im 60/60]
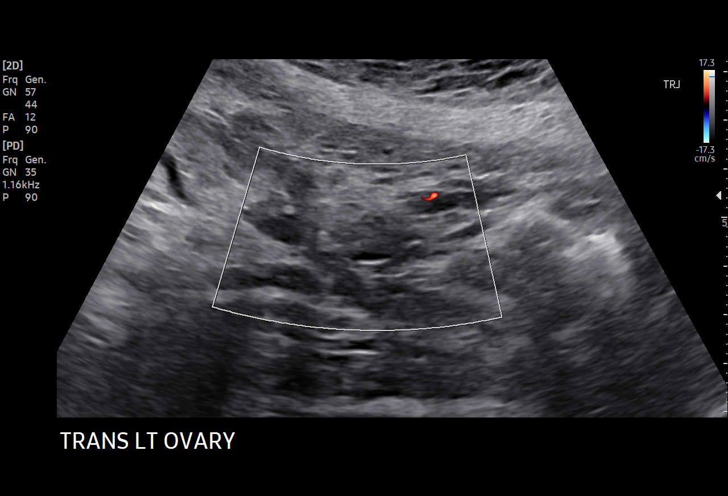

[15 of 28 positions shown; findings below may reference images not displayed]

FINDINGS: Intrauterine gestational sac: Single

Yolk sac:  Not Visualized.

Embryo:  Visualized.

Cardiac Activity: Visualized.

Heart Rate: 160 bpm

CRL:   72.2 mm   13 w 3 d                  US EDC: 12/03/2020

Subchorionic hemorrhage:  None visualized.

Maternal uterus/adnexae: Normal bilateral ovaries.  No free fluid.
IMPRESSION: Single viable intrauterine pregnancy. Estimated gestational age 13
weeks 3 days.

## 2021-05-31 NOTE — Telephone Encounter (Signed)
I called Ernesta Amble today with [employer] at 11:47 AM and confirmed patient's identity using two patient identifiers. [place] results from earlier today were reviewed. Patient is not yet scheduled for new OB visit. Will send in-basket message to schedule new OB visit. First trimester warning signs reviewed. Patient voiced understanding and had no further questions.   US OB Comp Less 14 Wks  Result Date: 05/31/2021 CLINICAL DATA:  Confirm dating EXAM: OBSTETRIC <14 WK ULTRASOUND TECHNIQUE: Transabdominal ultrasound was performed for evaluation of the gestation as well as the maternal uterus and adnexal regions. COMPARISON:  None. FINDINGS: Intrauterine gestational sac: Single Yolk sac:  Not Visualized. Embryo:  Visualized. Cardiac Activity: Visualized. Heart Rate: 160 bpm CRL:   72.2 mm   13 w 3 d                  [place] EDC: 12/03/2020 Subchorionic hemorrhage:  None visualized. Maternal uterus/adnexae: Normal bilateral ovaries.  No free fluid. IMPRESSION: Single viable intrauterine pregnancy. Estimated gestational age [redacted] weeks 3 days. Electronically Signed   By: Caprice Renshaw M.D.   On: 05/31/2021 09:37    Marny Lowenstein, PA-C 05/31/2021 11:47 AM

## 2021-06-08 NOTE — Progress Notes (Deleted)
New OB Intake  I connected with  Paula Shea on 06/08/21 at  9:15 AM EST by {Contact:24193} Video Visit and verified that I am speaking with the correct person using two identifiers. Nurse is located at [facility] and pt is located at ***.  I discussed the limitations, risks, security and privacy concerns of performing an evaluation and management service by telephone and the availability of in person appointments. I also discussed with the patient that there may be a patient responsible charge related to this service. The patient expressed understanding and agreed to proceed.  I explained I am completing New OB Intake today. We discussed her EDD of *** that is based on LMP of ***. Pt is G***/P***. I reviewed her allergies, medications, Medical/Surgical/OB history, and appropriate screenings. I informed her of [facility] services. Based on history, this is a/an  pregnancy {Complicated/Uncomplicated Pregnancy:20185} .   Patient Active Problem List   Diagnosis Date Noted   History of IUFD 02/19/2021    Concerns addressed today  Delivery Plans:  Plans to deliver at [facility].   MyChart/Babyscripts MyChart access verified. I explained pt will have some visits in office and some virtually. Babyscripts instructions given and order placed. Patient verifies receipt of registration text/e-mail. Account successfully created and app downloaded.  Blood Pressure Cuff  {blood pressure cuff:24241} Explained after first prenatal appt pt will check weekly and document in Babyscripts.  Weight scale: Patient    have weight scale. Weight scale ordered for patient to pick up form [facility].   Anatomy US Explained first scheduled [place] will be around 19 weeks. Anatomy US scheduled for *** at ***. Pt notified to arrive at ***.  Labs Discussed Avelina Laine genetic screening with patient. Would like both Panorama and Horizon drawn at new OB visit. Routine prenatal labs needed.  Covid Vaccine Patient {HAS/HAS NOT:20194}  covid vaccine.   Centering in Pregnancy Candidate?  If yes, offer as possibility  Mother/ Baby Dyad Candidate?    If yes, offer as possibility  Informed patient of [facility] website  and placed link in her AVS.   Social Determinants of Health Food Insecurity: {food:25541} [facility] Referral: Patient {ACTION; IS/IS YPP:50932671} interested in referral to [facility]  Transportation: {transportation:25542} Childcare: Discussed no children allowed at ultrasound appointments. Offered childcare services; {childcare:25540}  Send link to Pregnancy Navigators   Placed OB Box on problem list and updated  First visit review I reviewed new OB appt with pt. I explained she will have a pelvic exam, ob bloodwork with genetic screening, and PAP smear. Explained pt will be seen by *** at first visit; encounter routed to appropriate provider. Explained that patient will be seen by pregnancy navigator following visit with provider. [facility] information placed in AVS.   Aviva Signs, CMA 06/08/2021  9:09 AM

## 2021-06-10 ENCOUNTER — Telehealth (INDEPENDENT_AMBULATORY_CARE_PROVIDER_SITE_OTHER): Payer: Medicaid Other

## 2021-06-10 DIAGNOSIS — O09219 Supervision of pregnancy with history of pre-term labor, unspecified trimester: Secondary | ICD-10-CM

## 2021-06-10 DIAGNOSIS — O0990 Supervision of high risk pregnancy, unspecified, unspecified trimester: Secondary | ICD-10-CM | POA: Insufficient documentation

## 2021-06-10 NOTE — Patient Instructions (Signed)
  At our [facility], we work as an integrated team, providing care to address both physical and emotional health. Your medical provider may refer you to see our Behavioral Health Clinician (BHC) on the same day you see your medical provider, as availability permits; often scheduled virtually at your convenience.  Our BHC is available to all patients, visits generally last between 20-30 minutes, but can be longer or shorter, depending on patient need. The BHC offers help with stress management, coping with symptoms of depression and anxiety, major life changes , sleep issues, changing risky behavior, grief and loss, life stress, working on personal life goals, and  behavioral health issues, as these all affect your overall health and wellness.  The BHC is NOT available for the following: FMLA paperwork, court-ordered evaluations, specialty assessments (custody or disability), letters to employers, or obtaining certification for an emotional support animal. The BHC does not provide long-term therapy. You have the right to refuse integrated behavioral health services, or to reschedule to see the BHC at a later date.  Confidentiality exception: If it is suspected that a child or disabled adult is being abused or neglected, we are required by law to report that to either Child Protective Services or Adult Protective Services.  If you have a diagnosis of Bipolar affective disorder, Schizophrenia, or recurrent Major depressive disorder, we will recommend that you establish care with a psychiatrist, as these are lifelong, chronic conditions, and we want your overall emotional health and medications to be more closely monitored. If you anticipate needing extended maternity leave due to mental health issues postpartum, it it recommended you inform your medical provider, so we can put in a referral to a psychiatrist as soon as possible. The BHC is unable to recommend an extended maternity leave for mental  health issues. Your medical provider or BHC may refer you to a therapist for ongoing, traditional therapy, or to a psychiatrist, for medication management, if it would benefit your overall health. Depending on your insurance, you may have a copay or be charged a deductible, depending on your insurance, to see the BHC. If you are uninsured, it is recommended that you apply for financial assistance. (Forms may be requested at the front desk for in-person visits, via MyChart, or request a form during a virtual visit).  If you see the BHC more than 6 times, you will have to complete a comprehensive clinical assessment interview with the BHC to resume integrated services.  For virtual visits with the BHC, you must be physically in the state of [place] at the time of the visit. For example, if you live in [place], you will have to do an in-person visit with the BHC, and your out-of-state insurance may not cover behavioral health services in [place]. If you are going out of the state or country for any reason, the BHC may see you virtually when you return to [place], but not while you are physically outside of [place].    

## 2021-06-10 NOTE — Progress Notes (Signed)
New OB Intake  I connected with  Paula Shea on 06/10/21 at  1:15 PM EST by MyChart Video Visit and verified that I am speaking with the correct person using two identifiers. Nurse is located at [facility] and pt is located at home.  I discussed the limitations, risks, security and privacy concerns of performing an evaluation and management service by telephone and the availability of in person appointments. I also discussed with the patient that there may be a patient responsible charge related to this service. The patient expressed understanding and agreed to proceed.  I explained I am completing New OB Intake today. We discussed her EDD of 12/03/21 that is based on LMP of 02/26/21. Pt is G4/P2. I reviewed her allergies, medications, Medical/Surgical/OB history, and appropriate screenings. I informed her of [facility] services. Based on history, this is a/an  pregnancy complicated by preterm labor  .   Patient Active Problem List   Diagnosis Date Noted   Supervision of high risk pregnancy, antepartum 06/10/2021   History of IUFD 02/19/2021    Concerns addressed today  Delivery Plans:  Plans to deliver at [facility].   MyChart/Babyscripts MyChart access verified. I explained pt will have some visits in office and some virtually. Babyscripts instructions given and order placed. Patient verifies receipt of registration text/e-mail. Account successfully created and app downloaded.  Blood Pressure Cuff  Blood pressure cuff ordered for patient to pick-up from [employer]. Explained after first prenatal appt pt will check weekly and document in Babyscripts.  Weight scale: Patient    have weight scale. Weight scale ordered for patient to pick up form [facility].   Anatomy US Explained first scheduled [place] will be around 19 weeks. Anatomy US scheduled for 07/13/21 at 09:00a. Pt notified to arrive at 09:45a.  Labs Discussed Avelina Laine genetic screening with patient. Would like both Panorama and  Horizon drawn at new OB visit. Routine prenatal labs needed.  Covid Vaccine Patient has covid vaccine.   Centering in Pregnancy Candidate?  If yes, offer as possibility  Mother/ Baby Dyad Candidate?    If yes, offer as possibility  Informed patient of [facility] website  and placed link in her AVS.   Social Determinants of Health Food Insecurity: Patient denies food insecurity. WIC Referral: Patient is interested in referral to [facility].  Transportation: Patient denies transportation needs. Childcare: Discussed no children allowed at ultrasound appointments. Offered childcare services; patient declines childcare services at this time.  Send link to Pregnancy Navigators   Placed OB Box on problem list and updated  First visit review I reviewed new OB appt with pt. I explained she will have a pelvic exam, ob bloodwork with genetic screening, and PAP smear. Explained pt will be seen by Dr. Crissie Reese at first visit; encounter routed to appropriate provider. Explained that patient will be seen by pregnancy navigator following visit with provider. [facility] information placed in AVS.   Henrietta Dine, CMA 06/10/2021  1:31 PM

## 2021-06-13 NOTE — Progress Notes (Signed)
Patient was assessed and managed by nursing staff during this encounter. I have reviewed the chart and agree with the documentation and plan. I have also made any necessary editorial changes.  [place] Bing, MD 06/13/2021 9:10 PM

## 2021-06-22 ENCOUNTER — Encounter: Payer: Self-pay | Admitting: Family Medicine

## 2021-06-22 ENCOUNTER — Other Ambulatory Visit: Payer: Self-pay

## 2021-06-22 ENCOUNTER — Ambulatory Visit (INDEPENDENT_AMBULATORY_CARE_PROVIDER_SITE_OTHER): Payer: Medicaid Other | Admitting: Family Medicine

## 2021-06-22 ENCOUNTER — Other Ambulatory Visit (HOSPITAL_COMMUNITY)
Admission: RE | Admit: 2021-06-22 | Discharge: 2021-06-22 | Disposition: A | Discharge status: Home / Self Care | Payer: Medicaid Other | Source: Ambulatory Visit | Attending: Family Medicine | Admitting: Family Medicine

## 2021-06-22 VITALS — BP 109/75 | HR 88 | Wt 167.1 lb

## 2021-06-22 DIAGNOSIS — Z98891 History of uterine scar from previous surgery: Secondary | ICD-10-CM

## 2021-06-22 DIAGNOSIS — Z23 Encounter for immunization: Secondary | ICD-10-CM | POA: Diagnosis not present

## 2021-06-22 DIAGNOSIS — O0990 Supervision of high risk pregnancy, unspecified, unspecified trimester: Secondary | ICD-10-CM | POA: Insufficient documentation

## 2021-06-22 DIAGNOSIS — Z8759 Personal history of other complications of pregnancy, childbirth and the puerperium: Secondary | ICD-10-CM | POA: Diagnosis not present

## 2021-06-22 NOTE — Patient Instructions (Signed)
[facility] [address] [place],  [place]  [address] Get Driving Directions Main: [phone]  Sunday:Closed Monday:8:30 AM - 5:00 PM Tuesday:8:30 AM - 5:00 PM Wednesday:8:30 AM - 5:00 PM Thursday:8:30 AM - 5:00 PM Friday:8:30 AM - 5:00 PM Saturday:Closed Closed for lunch 12:30-1:30 p.m.

## 2021-06-22 NOTE — Progress Notes (Signed)
Subjective:   Paula Shea is a 35 y.o. [id number] at [redacted]w[redacted]d by 13 week [place] being seen today for her first obstetrical visit.  Her obstetrical history is significant for  history of two prior cesareans, history of IUFD earlier this year . Patient does intend to breast feed. Pregnancy history fully reviewed.  Patient reports no complaints.  HISTORY: OB History  Gravida Para Term Preterm AB Living  4 3 2 1  0 2  SAB IAB Ectopic Multiple Live Births  0 0 0 0 2    # Outcome Date GA Lbr Len/2nd Weight Sex Delivery Anes PTL Lv  4 Current           3 Preterm 01/24/21 [redacted]w[redacted]d 01:31 / 00:13 1 lb 0.2 oz (0.459 kg) M Vag-Spont EPI  FD     Birth Comments: IUFD     Name: Paula Shea FD     Apgar1: 0  Apgar5: 0  2 Term 01/07/17 [redacted]w[redacted]d  8 lb 13.1 oz (4 kg) M CS-Unspec EPI N LIV  1 Term 02/15/15 [redacted]w[redacted]d  8 lb 13.1 oz (4 kg) M CS-Unspec EPI  LIV     Last pap smear: Lab Results  Component Value Date   DIAGPAP  12/24/2020    - Negative for intraepithelial lesion or malignancy (NILM)   HPVHIGH Negative 12/24/2020     Past Medical History:  Diagnosis Date   Fetal demise, greater than 22 weeks, antepartum 01/23/2021   Medical history non-contributory    Supervision of normal first pregnancy, antepartum 12/14/2020    Nursing Staff Provider Office Location  [facility] Dating   LMP Language   [facility]   Flu Vaccine   Genetic/Carrier Screen  NIPS:   Low risk female AFP:    Horizon: Neg TDaP Vaccine    Hgb A1C or  GTT Early  Third trimester  COVID Vaccine    LAB RESULTS  Rhogam  na Blood Type O/Positive/-- (06/23 1629)  Baby Feeding Plan breast Antibody Negative (06/23 1629) Contraception  Rubella 2.86 (06/23 1629)   Ultrasound scan done for inability to hear fetal heart tones 01/21/2021   Past Surgical History:  Procedure Laterality Date   CESAREAN SECTION     2x   History reviewed. No pertinent family history. Social History   Tobacco Use   Smoking status: Never   Smokeless tobacco: Never   Substance Use Topics   Alcohol use: Never   Drug use: Never   No Known Allergies Current Outpatient Medications on File Prior to Visit  Medication Sig Dispense Refill   Prenatal Vit-Fe Fumarate-FA (MULTIVITAMIN-PRENATAL) 27-0.8 MG TABS tablet Take 1 tablet by mouth daily at 12 noon.     No current facility-administered medications on file prior to visit.     Exam   Vitals:   06/22/21 1407  BP: 109/75  Pulse: 88  Weight: 167 lb 1.6 oz (75.8 kg)   Fetal Heart Rate (bpm): 162  System: General: well-developed, well-nourished female in no acute distress   Skin: normal coloration and turgor, no rashes   Neurologic: oriented, normal, negative, normal mood   Extremities: normal strength, tone, and muscle mass, ROM of all joints is normal   HEENT PERRLA, extraocular movement intact and sclera clear, anicteric   Neck supple and no masses   Respiratory:  no respiratory distress      Assessment:   Pregnancy: 06/24/21 Patient Active Problem List   Diagnosis Date Noted   Supervision of high risk pregnancy, antepartum  06/10/2021   History of IUFD 02/19/2021   History of cesarean delivery 12/24/2020     Plan:  1. Supervision of high risk pregnancy, antepartum Initial labs drawn. Continue prenatal vitamins. Genetic Screening discussed, NIPS: ordered. Ultrasound discussed; fetal anatomic survey: ordered. Problem list reviewed and updated. The nature of [facility] with multiple MDs and other Advanced Practice Providers was explained to patient; also emphasized that residents, students are part of our team.  2. History of IUFD Found at 22 weeks Had VBAC on 01/24/21 c/b retained placenta and PPH  3. History of cesarean delivery X2 in [place] May want to [facility], discussed she would need to go into spontaneous labor and plan for RCS if this does not occur by 39 weeks  Routine obstetric precautions reviewed. Return in about 4 weeks (around  07/20/2021) for [facility], ob visit.

## 2021-06-23 LAB — GC/CHLAMYDIA PROBE AMP (~~LOC~~) NOT AT ARMC
Chlamydia: NEGATIVE
Comment: NEGATIVE
Comment: NORMAL
Neisseria Gonorrhea: NEGATIVE

## 2021-06-24 LAB — CULTURE, OB URINE

## 2021-06-24 LAB — URINE CULTURE, OB REFLEX

## 2021-07-02 LAB — CBC/D/PLT+RPR+RH+ABO+RUBIGG...
Antibody Screen: NEGATIVE
Basophils Absolute: 0 10*3/uL (ref 0.0–0.2)
Basos: 0 %
EOS (ABSOLUTE): 0.1 10*3/uL (ref 0.0–0.4)
Eos: 1 %
HCV Ab: <0.1 s/co ratio (ref 0.0–0.9)
HIV Screen 4th Generation wRfx: NONREACTIVE
Hematocrit: 33.2 % — ABNORMAL LOW (ref 34.0–46.6)
Hemoglobin: 10.8 g/dL — ABNORMAL LOW (ref 11.1–15.9)
Hepatitis B Surface Ag: NEGATIVE
Immature Grans (Abs): 0 10*3/uL (ref 0.0–0.1)
Immature Granulocytes: 0 %
Lymphocytes Absolute: 1.5 10*3/uL (ref 0.7–3.1)
Lymphs: 20 %
MCH: 26.3 pg — ABNORMAL LOW (ref 26.6–33.0)
MCHC: 32.5 g/dL (ref 31.5–35.7)
MCV: 81 fL (ref 79–97)
Monocytes Absolute: 0.4 10*3/uL (ref 0.1–0.9)
Monocytes: 6 %
Neutrophils Absolute: 5.6 10*3/uL (ref 1.4–7.0)
Neutrophils: 73 %
Platelets: 320 10*3/uL (ref 150–450)
RBC: 4.11 x10E6/uL (ref 3.77–5.28)
RDW: 20 % — ABNORMAL HIGH (ref 11.7–15.4)
RPR Ser Ql: NONREACTIVE
Rh Factor: POSITIVE
Rubella Antibodies, IGG: 2.42 index (ref >0.99)
WBC: 7.7 10*3/uL (ref 3.4–10.8)

## 2021-07-02 LAB — AFP, SERUM, OPEN SPINA BIFIDA
AFP MoM: 1.12
AFP Value: 39.7 ng/mL
Gest. Age on Collection Date: 16.6 weeks
Maternal Age At EDD: 34.2 yr
OSBR Risk 1 IN: 8371
Test Results:: NEGATIVE
Weight: 167 [lb_av]

## 2021-07-02 LAB — HEMOGLOBIN A1C
Est. average glucose Bld gHb Est-mCnc: 105 mg/dL
Hgb A1c MFr Bld: 5.3 % (ref 4.8–5.6)

## 2021-07-02 LAB — HCV INTERPRETATION

## 2021-07-13 ENCOUNTER — Ambulatory Visit: Payer: Medicaid Other | Attending: Family Medicine

## 2021-07-13 ENCOUNTER — Ambulatory Visit: Payer: Medicaid Other | Admitting: *Deleted

## 2021-07-13 ENCOUNTER — Other Ambulatory Visit: Payer: Self-pay

## 2021-07-13 ENCOUNTER — Other Ambulatory Visit: Payer: Self-pay | Admitting: *Deleted

## 2021-07-13 VITALS — BP 127/86 | HR 84

## 2021-07-13 DIAGNOSIS — Z8759 Personal history of other complications of pregnancy, childbirth and the puerperium: Secondary | ICD-10-CM

## 2021-07-13 DIAGNOSIS — Z362 Encounter for other antenatal screening follow-up: Secondary | ICD-10-CM

## 2021-07-13 DIAGNOSIS — O0990 Supervision of high risk pregnancy, unspecified, unspecified trimester: Secondary | ICD-10-CM | POA: Diagnosis not present

## 2021-07-13 IMAGING — US US MFM OB DETAIL+14 WK
1 series · 13 of 28 positions shown · non-contrast
Comparison: none

[Series 1: us mfm ob detail+14 wk · 13 of 154 slices shown]
[im 6/154]
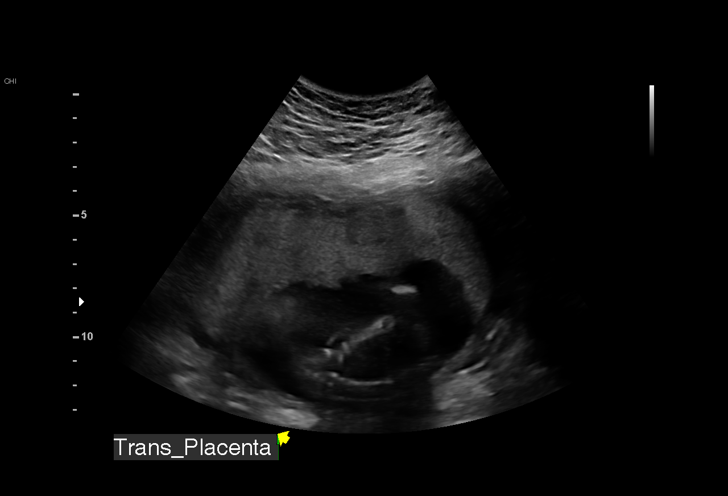
[im 18/154]
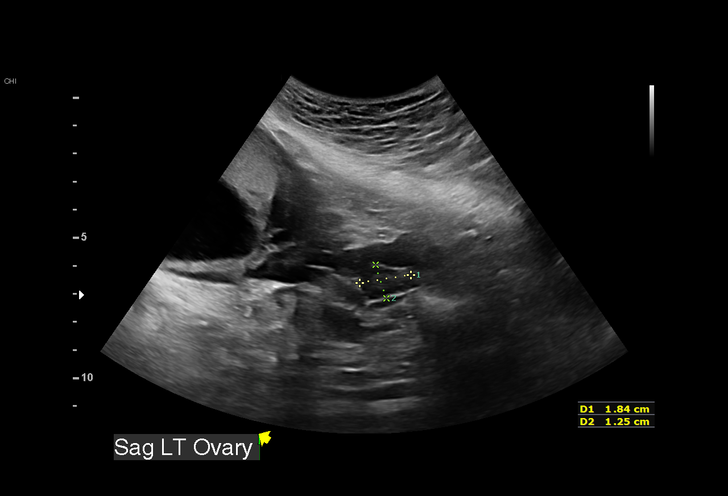
[im 29/154]
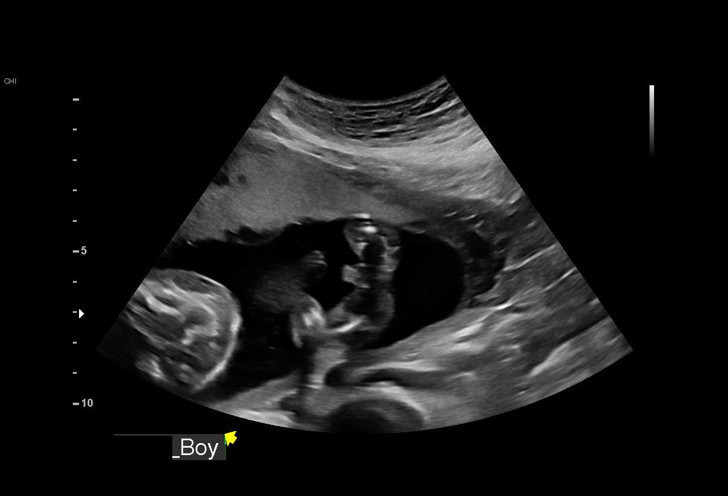
[im 40/154]
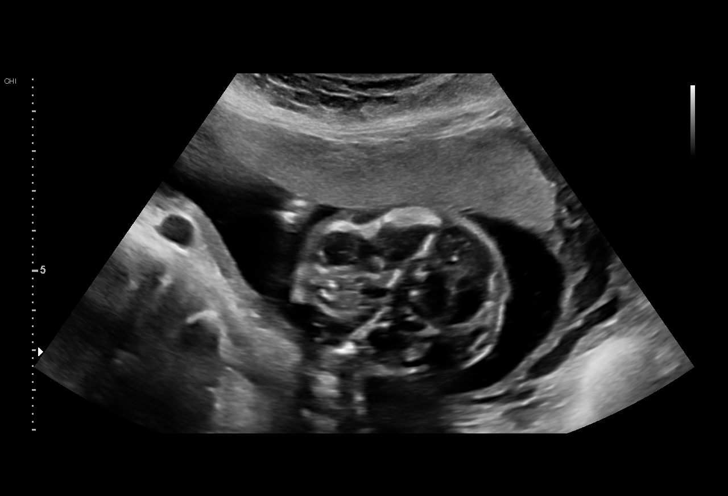
[im 52/154]
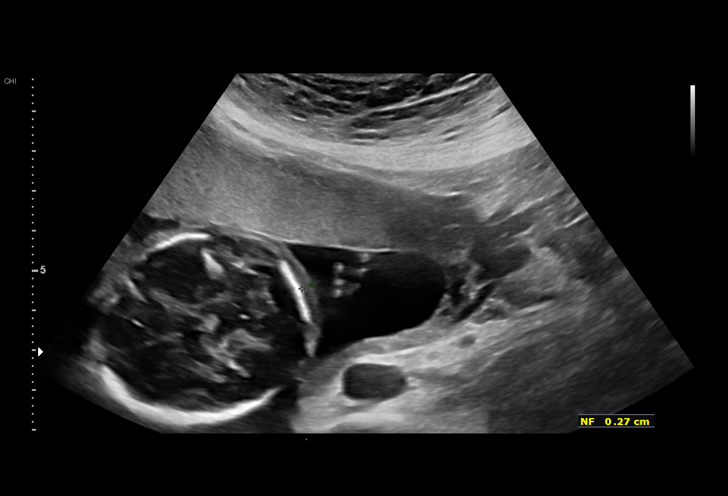
[im 63/154]
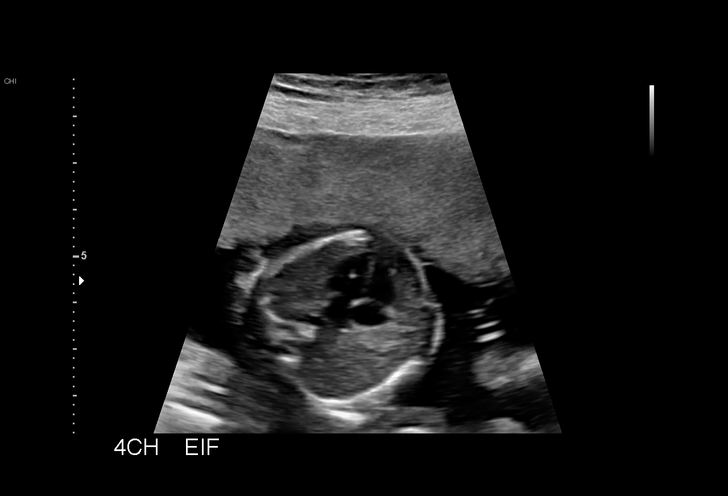
[im 80/154]
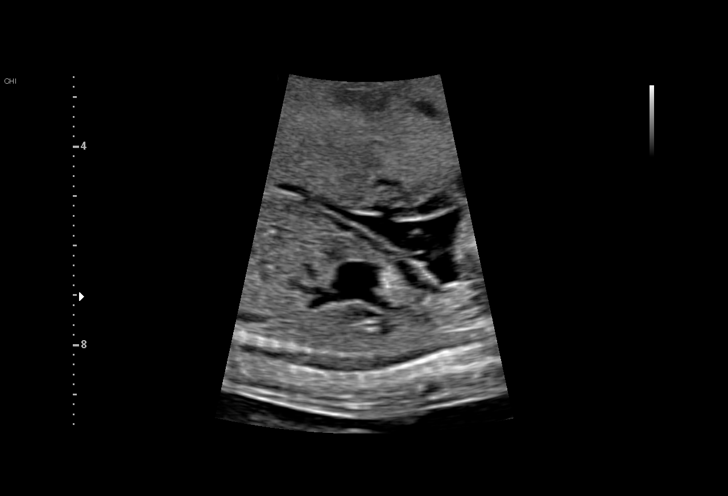
[im 91/154]
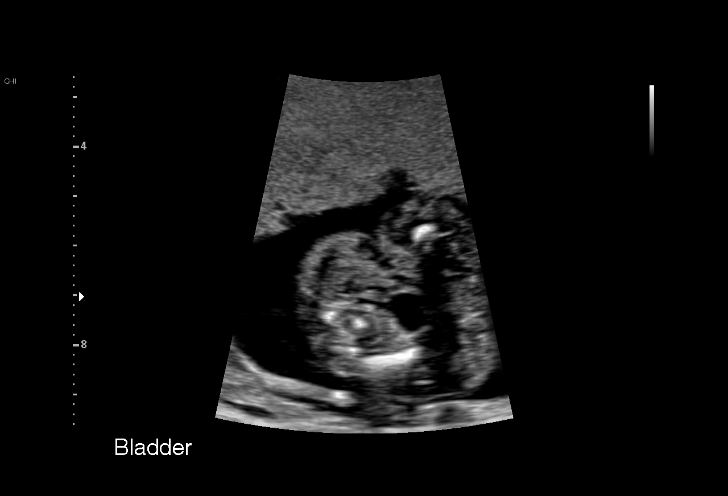
[im 103/154]
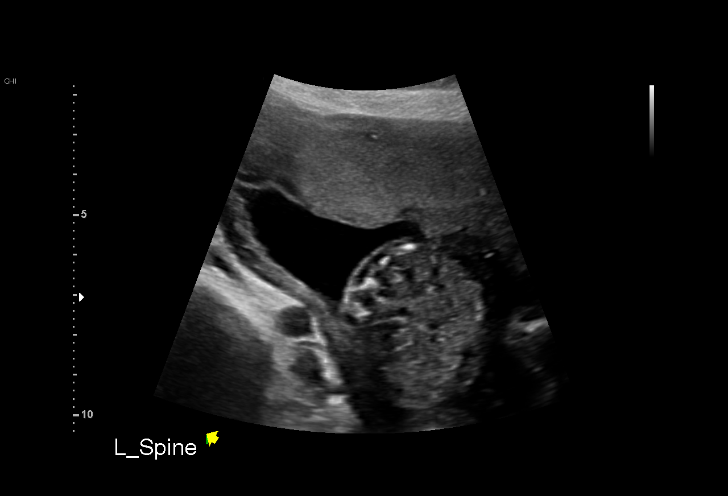
[im 114/154]
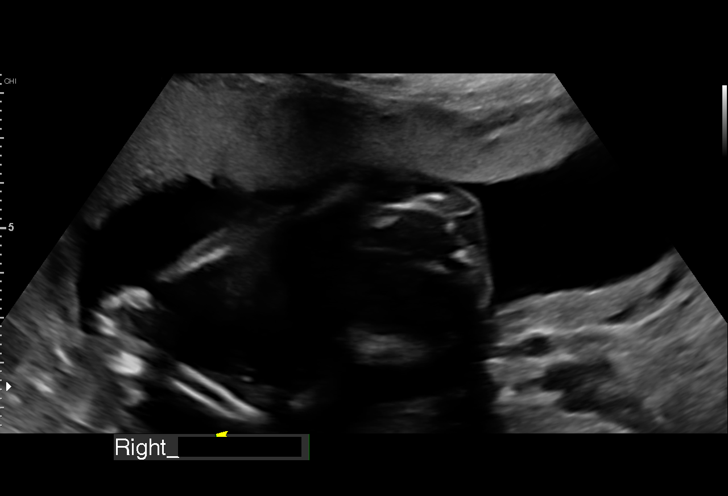
[im 125/154]
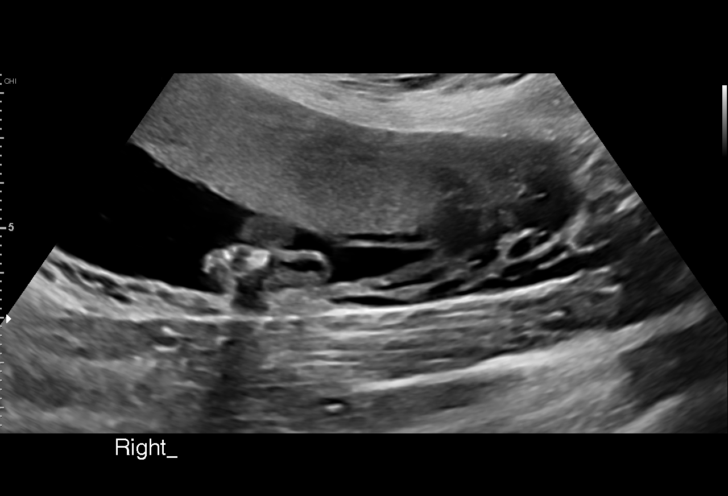
[im 137/154]
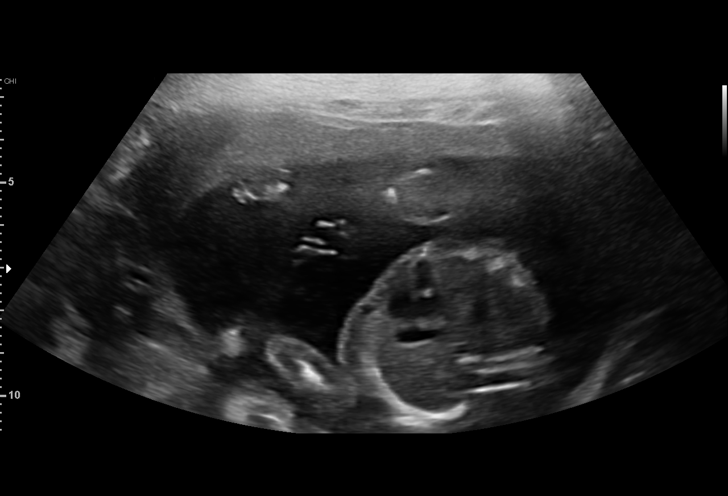
[im 148/154]
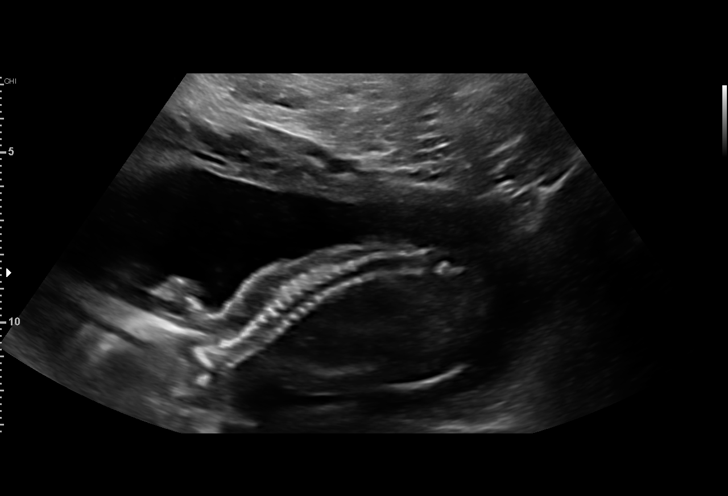

[13 of 28 positions shown; findings below may reference images not displayed]

Indications

 19 weeks gestation of pregnancy
 Encounter for antenatal screening for
 malformations
 Poor obstetric history: Previous IUFD
 (stillbirth) 22 weeks
Fetal Evaluation

 Num Of Fetuses:         1
 Fetal Heart Rate(bpm):  158
 Cardiac Activity:       Observed
 Presentation:           Cephalic
 Placenta:               Anterior
 P. Cord Insertion:      Visualized, central

 Amniotic Fluid
 AFI FV:      Within normal limits

                             Largest Pocket(cm)

Biometry

 BPD:      43.2  mm     G. Age:  19w 0d         29  %    CI:        68.32   %    70 - 86
                                                         FL/HC:      17.9   %    16.8 -
 HC:      167.1  mm     G. Age:  19w 3d         33  %    HC/AC:      1.15        1.09 -
 AC:      145.2  mm     G. Age:  19w 6d         54  %    FL/BPD:     69.2   %
 FL:       29.9  mm     G. Age:  19w 2d         30  %    FL/AC:      20.6   %    20 - 24
 HUM:      29.8  mm     G. Age:  19w 6d         57  %
 CER:      19.1  mm     G. Age:  18w 5d         26  %
 NFT:       2.7  mm

 LV:        8.2  mm
 CM:        3.2  mm

 Est. FW:     297  gm    0 lb 10 oz      42  %
OB History

 Gravidity:    4         Term:   2        Prem:   1
Gestational Age

 LMP:           16w 6d        Date:  03/17/21                 EDD:   12/22/21
 U/S Today:     19w 3d                                        EDD:   12/04/21
 Best:          19w 4d     Det. By:  Early Ultrasound         EDD:   12/03/21
                                     (05/31/21)
Anatomy

 Cranium:               Appears normal         LVOT:                   Appears normal
 Cavum:                 Appears normal         Aortic Arch:            Appears normal
 Ventricles:            Appears normal         Ductal Arch:            Appears normal
 Choroid Plexus:        Appears normal         Diaphragm:              Appears normal
 Cerebellum:            Appears normal         Stomach:                Appears normal, left
                                                                       sided
 Posterior Fossa:       Appears normal         Abdomen:                Appears normal
 Nuchal Fold:           Appears normal         Abdominal Wall:         Appears nml (cord
                                                                       insert, abd wall)
 Face:                  Appears normal         Cord Vessels:           Appears normal (3
                        (orbits and profile)                           vessel cord)
 Lips:                  Appears normal         Kidneys:                Appear normal
 Palate:                Not well visualized    Bladder:                Appears normal
 Thoracic:              Appears normal         Spine:                  Ltd views no
                                                                       intracranial signs of
                                                                       NTD
 Heart:                 Appears normal; EIF    Upper Extremities:      Appears normal
 RVOT:                  Appears normal         Lower Extremities:      Appears normal

 Other:  Fetus appears to be a male.Nasal bone and lenses visualized.
Cervix Uterus Adnexa

 Cervix
 Length:            3.5  cm.
 Normal appearance by transabdominal scan.

 Uterus
 No abnormality visualized.

 Right Ovary
 Within normal limits.

 Left Ovary
 Within normal limits.
 Cul De Sac
 No free fluid seen.

 Adnexa
 No abnormality visualized.
Impression

 Single intrauterine pregnancy here for a detailed anatomy
 due history of 25 week IUFD, advanced maternal age and
 new echogenic intracardiac focus.
 Normal anatomy with measurements consistent with dates
 There is good fetal movement and amniotic fluid volume
 Suboptimal views of the fetal anatomy were obtained
 secondary to fetal position.

 An echogenic intracardiac focus was observed today. I
 discussed that there is no increased risk to the function or
 structure of the heart. In addition, in the context of a low risk
 NIPS result the risk for aneuploidy are reduced. There were
 no additional markers of aneuploidy observed.  I reviewed
 that an ultrasound is a screening exam and that a diagnostic
 exam via amnioticenetesis is the only test available to provide
 a definitive result.
Recommendations

 Follow up growth in 4 weeks to complete fetal anatomy.

## 2021-07-20 ENCOUNTER — Ambulatory Visit (INDEPENDENT_AMBULATORY_CARE_PROVIDER_SITE_OTHER): Payer: Medicaid Other | Admitting: Family Medicine

## 2021-07-20 ENCOUNTER — Other Ambulatory Visit: Payer: Self-pay

## 2021-07-20 ENCOUNTER — Encounter: Payer: Self-pay | Admitting: Family Medicine

## 2021-07-20 VITALS — BP 114/76 | HR 91 | Wt 170.0 lb

## 2021-07-20 DIAGNOSIS — Z8759 Personal history of other complications of pregnancy, childbirth and the puerperium: Secondary | ICD-10-CM

## 2021-07-20 DIAGNOSIS — O0990 Supervision of high risk pregnancy, unspecified, unspecified trimester: Secondary | ICD-10-CM

## 2021-07-20 DIAGNOSIS — Z98891 History of uterine scar from previous surgery: Secondary | ICD-10-CM

## 2021-07-20 MED ORDER — PRENATAL 27-0.8 MG PO TABS: 1.0000 | ORAL_TABLET | Freq: Every day | ORAL | 11 refills | Status: AC

## 2021-07-20 NOTE — Progress Notes (Signed)
° °  Subjective:  Paula Shea is a 36 y.o. [id number] at [redacted]w[redacted]d being seen today for ongoing prenatal care.  She is currently monitored for the following issues for this high-risk pregnancy and has History of cesarean delivery; History of IUFD; and Supervision of high risk pregnancy, antepartum on their problem list.  Patient reports no complaints.  Contractions: Not present. Vag. Bleeding: None.  Movement: Absent. Denies leaking of fluid.   The following portions of the patient's history were reviewed and updated as appropriate: allergies, current medications, past family history, past medical history, past social history, past surgical history and problem list. Problem list updated.  Objective:   Vitals:   07/20/21 0848  BP: 114/76  Pulse: 91  Weight: 170 lb (77.1 kg)    Fetal Status: Fetal Heart Rate (bpm): 145   Movement: Absent     General:  Alert, oriented and cooperative. Patient is in no acute distress.  Skin: Skin is warm and dry. No rash noted.   Cardiovascular: Normal heart rate noted  Respiratory: Normal respiratory effort, no problems with respiration noted  Abdomen: Soft, gravid, appropriate for gestational age. Pain/Pressure: Absent     Pelvic: Vag. Bleeding: None     Cervical exam deferred        Extremities: Normal range of motion.  Edema: None  Mental Status: Normal mood and affect. Normal behavior. Normal judgment and thought content.   Urinalysis:      Assessment and Plan:  Pregnancy: [id number] at [redacted]w[redacted]d  1. Supervision of high risk pregnancy, antepartum BP and FHR normal  2. History of IUFD 22 weeks Had VBAC on 01/24/21 c/b retained placenta and PPH Antenatal testing per MFM  3. History of cesarean delivery X2, recommend repeat, she is in agreement  Preterm labor symptoms and general obstetric precautions including but not limited to vaginal bleeding, contractions, leaking of fluid and fetal movement were reviewed in detail with the patient. Please refer to  After Visit Summary for other counseling recommendations.  Return in 4 weeks (on 08/17/2021) for [facility], ob visit.   Clarnce Flock, MD

## 2021-08-13 ENCOUNTER — Ambulatory Visit: Payer: Medicaid Other

## 2021-08-17 ENCOUNTER — Ambulatory Visit (INDEPENDENT_AMBULATORY_CARE_PROVIDER_SITE_OTHER): Payer: Medicaid Other | Admitting: Family Medicine

## 2021-08-17 ENCOUNTER — Encounter: Payer: Self-pay | Admitting: *Deleted

## 2021-08-17 ENCOUNTER — Ambulatory Visit: Payer: Medicaid Other | Admitting: *Deleted

## 2021-08-17 ENCOUNTER — Ambulatory Visit: Payer: Medicaid Other | Attending: Maternal & Fetal Medicine

## 2021-08-17 ENCOUNTER — Other Ambulatory Visit: Payer: Self-pay

## 2021-08-17 ENCOUNTER — Other Ambulatory Visit: Payer: Self-pay | Admitting: *Deleted

## 2021-08-17 ENCOUNTER — Encounter: Payer: Self-pay | Admitting: Family Medicine

## 2021-08-17 VITALS — BP 130/80 | HR 78 | Wt 174.5 lb

## 2021-08-17 VITALS — BP 116/65 | HR 70

## 2021-08-17 DIAGNOSIS — Z98891 History of uterine scar from previous surgery: Secondary | ICD-10-CM

## 2021-08-17 DIAGNOSIS — O34219 Maternal care for unspecified type scar from previous cesarean delivery: Secondary | ICD-10-CM

## 2021-08-17 DIAGNOSIS — O09292 Supervision of pregnancy with other poor reproductive or obstetric history, second trimester: Secondary | ICD-10-CM

## 2021-08-17 DIAGNOSIS — Z8759 Personal history of other complications of pregnancy, childbirth and the puerperium: Secondary | ICD-10-CM | POA: Diagnosis present

## 2021-08-17 DIAGNOSIS — Z362 Encounter for other antenatal screening follow-up: Secondary | ICD-10-CM | POA: Insufficient documentation

## 2021-08-17 DIAGNOSIS — O0990 Supervision of high risk pregnancy, unspecified, unspecified trimester: Secondary | ICD-10-CM | POA: Diagnosis present

## 2021-08-17 DIAGNOSIS — Z3A24 24 weeks gestation of pregnancy: Secondary | ICD-10-CM

## 2021-08-17 DIAGNOSIS — O283 Abnormal ultrasonic finding on antenatal screening of mother: Secondary | ICD-10-CM

## 2021-08-17 IMAGING — US US MFM OB FOLLOW-UP
1 series · 13 of 28 positions shown · non-contrast
Comparison: none

[Series 1: us mfm ob follow-up · 13 of 53 slices shown]
[im 2/53]
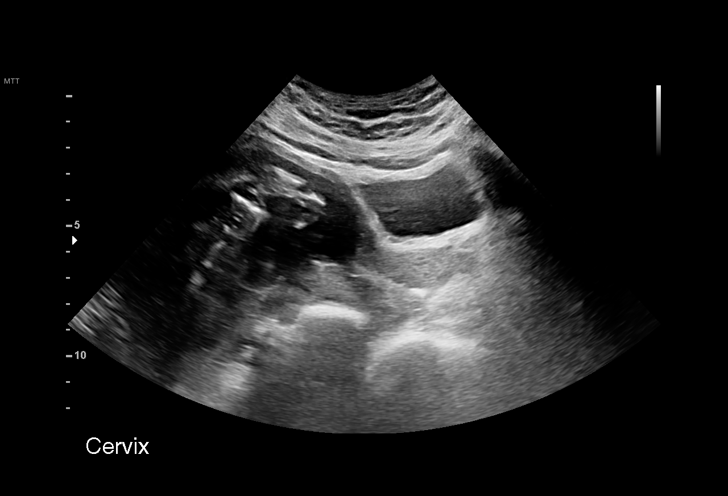
[im 6/53]
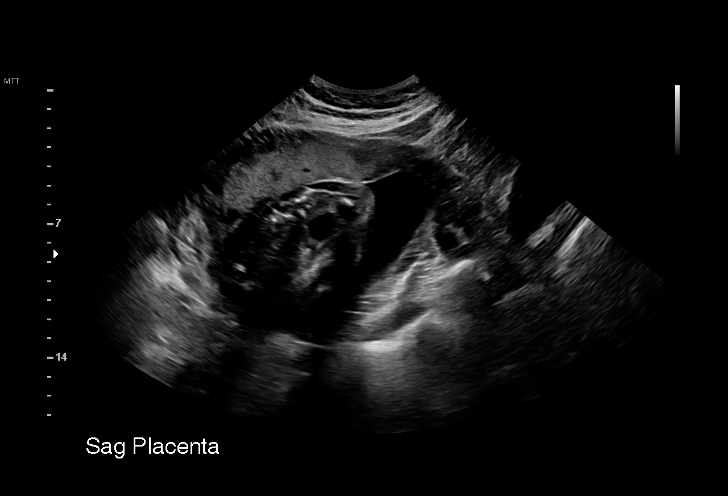
[im 10/53]
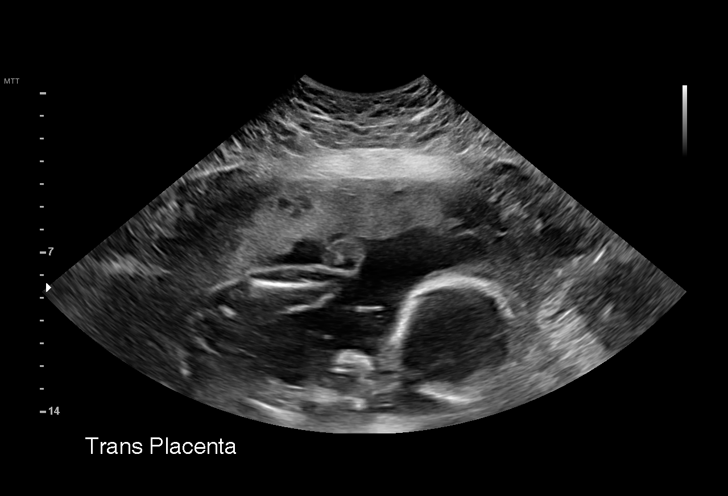
[im 14/53]
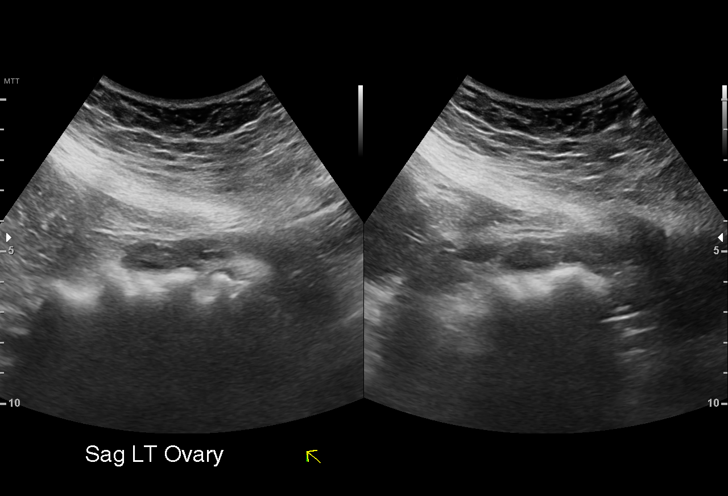
[im 18/53]
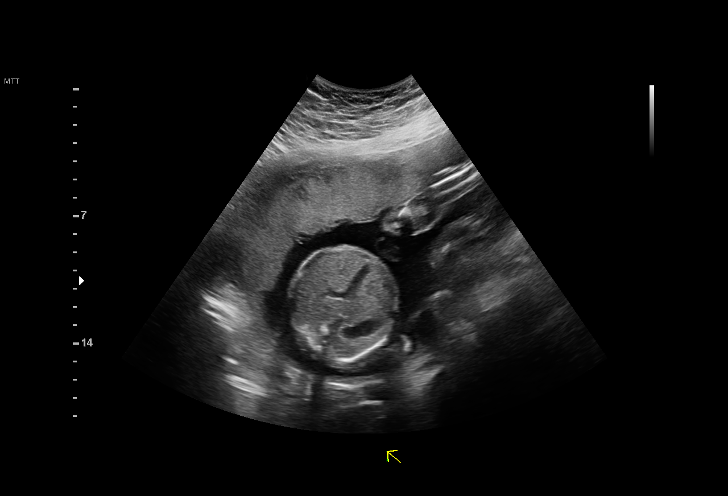
[im 22/53]
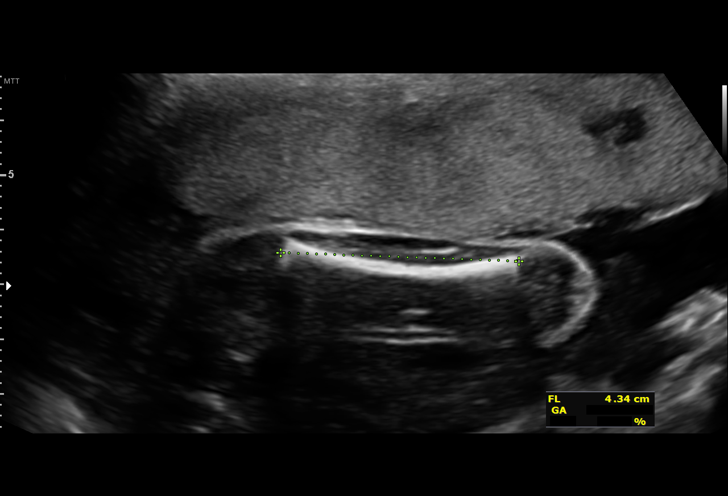
[im 27/53]
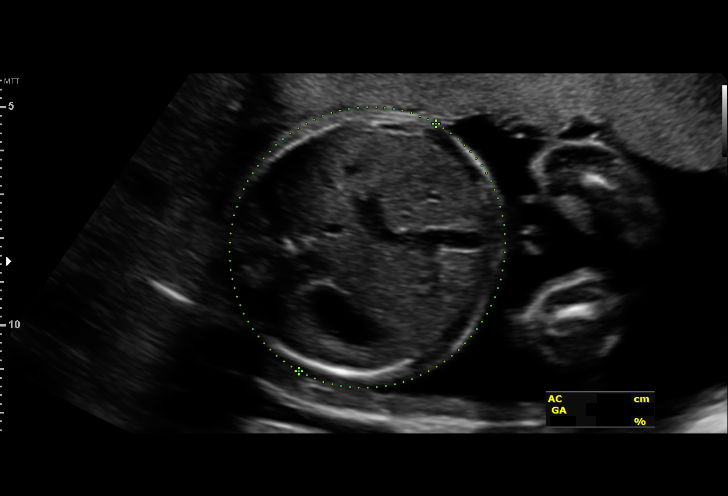
[im 31/53]
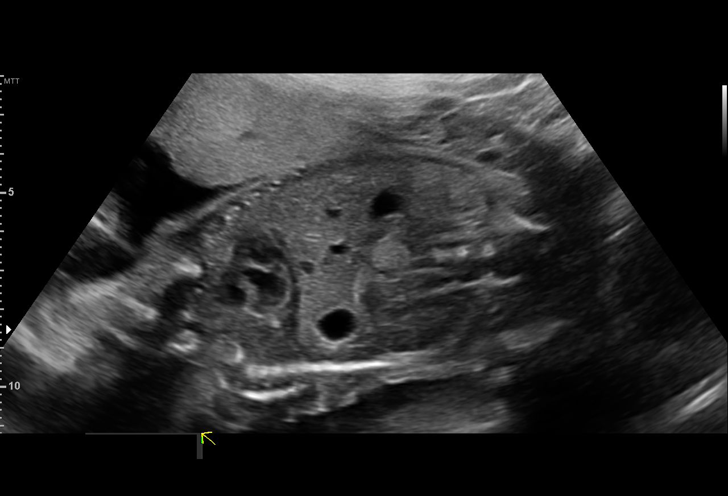
[im 35/53]
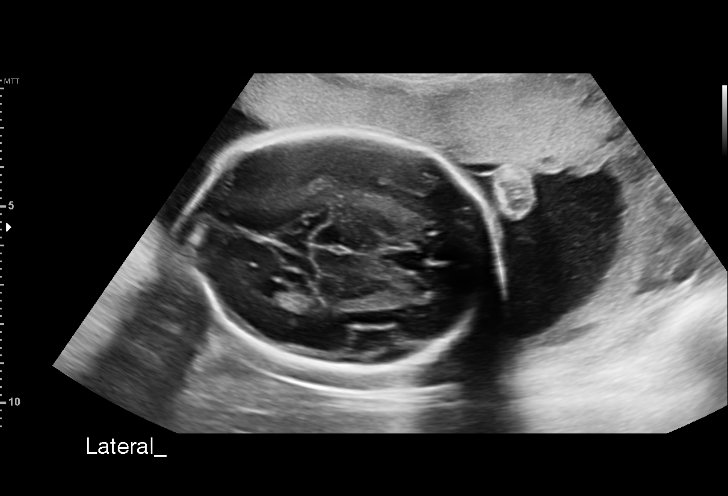
[im 39/53]
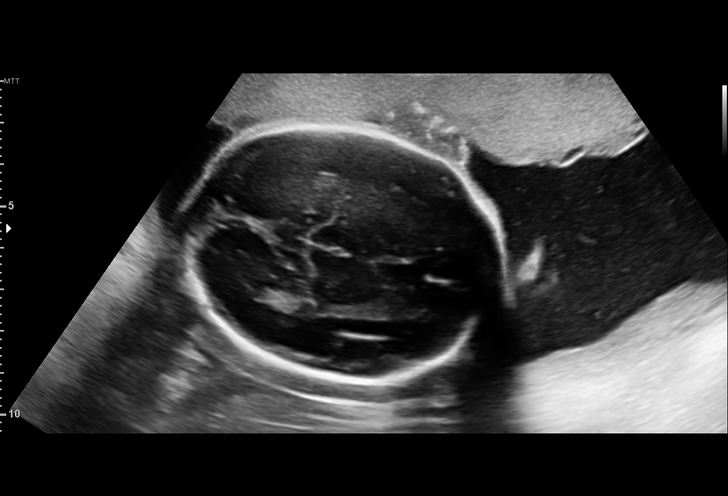
[im 43/53]
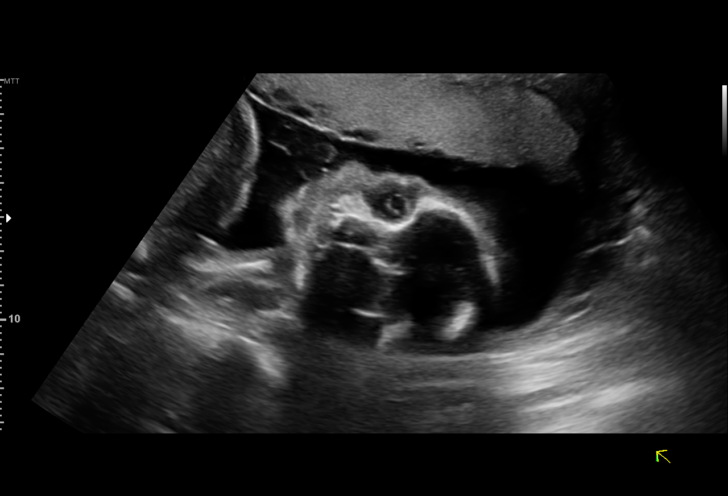
[im 47/53]
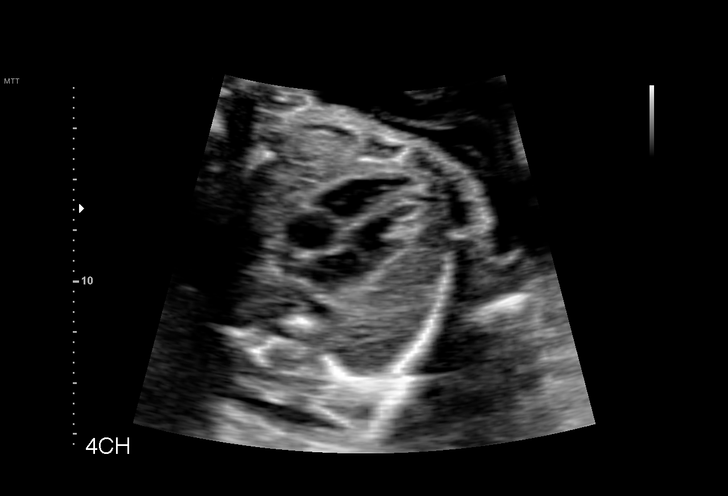
[im 51/53]
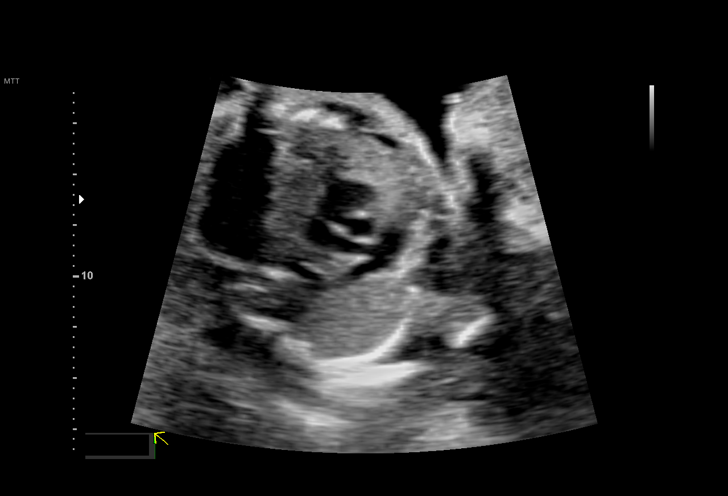

[13 of 28 positions shown; findings below may reference images not displayed]

KRISTEN

Indications

 24 weeks gestation of pregnancy
 Encounter for antenatal screening for
 malformations
 Poor obstetric history: Previous IUFD
 (stillbirth) 22 weeks
 Previous cesarean delivery, antepartum
 Echogenic intracardiac focus of the heart
 (EIF)
Fetal Evaluation

 Num Of Fetuses:         1
 Fetal Heart Rate(bpm):  141
 Cardiac Activity:       Observed
 Presentation:           Variable
 Placenta:               Anterior
 P. Cord Insertion:      Previously Visualized

 Amniotic Fluid
 AFI FV:      Within normal limits

                             Largest Pocket(cm)

Biometry
 BPD:      59.6  mm     G. Age:  24w 2d         33  %    CI:        71.85   %    70 - 86
                                                         FL/HC:      19.4   %    18.7 -
 HC:      223.8  mm     G. Age:  24w 3d         24  %    HC/AC:      1.11        1.04 -
 AC:      200.8  mm     G. Age:  24w 5d         45  %    FL/BPD:     73.0   %    71 - 87
 FL:       43.5  mm     G. Age:  24w 2d         27  %    FL/AC:      21.7   %    20 - 24
 CER:      26.5  mm     G. Age:  23w 6d         39  %
 LV:        5.5  mm
 CM:        5.8  mm

 Est. FW:     703  gm      1 lb 9 oz     37  %
OB History

 Gravidity:    4         Term:   2        Prem:   1
Gestational Age

 LMP:           21w 6d        Date:  03/17/21                 EDD:   12/22/21
 U/S Today:     24w 3d                                        EDD:   12/04/21
 Best:          24w 4d     Det. By:  Early Ultrasound         EDD:   12/03/21
                                     (05/31/21)
Anatomy

 Cranium:               Appears normal         LVOT:                   Previously seen
 Cavum:                 Appears normal         Aortic Arch:            Previously seen
 Ventricles:            Appears normal         Ductal Arch:            Previously seen
 Choroid Plexus:        Previously seen        Diaphragm:              Appears normal
 Cerebellum:            Appears normal         Stomach:                Appears normal, left
                                                                       sided
 Posterior Fossa:       Previously seen        Abdomen:                Previously seen
 Nuchal Fold:           Previously seen        Abdominal Wall:         Appears nml (cord
                                                                       insert, abd wall)
 Face:                  Orbits and profile     Cord Vessels:           Appears normal (3
                        previously seen                                vessel cord)
 Lips:                  Previously seen        Kidneys:                Appear normal
 Palate:                Not well visualized    Bladder:                Appears normal
 Thoracic:              Previously seen        Spine:                  Limited views
                                                                       previously seen
 Heart:                 Previously seen; EIF   Upper Extremities:      Previously seen
 RVOT:                  Previously seen        Lower Extremities:      Previously seen

 Other:  Male gender previously seen. Nasal bone and lenses visualized
         previously.
Cervix Uterus Adnexa

 Cervix
 Length:           3.09  cm.
 Normal appearance by transabdominal scan.

 Uterus
 No abnormality visualized.

 Right Ovary
 Within normal limits.
 Left Ovary
 Within normal limits.

 Cul De Sac
 No free fluid seen.

 Adnexa
 No abnormality visualized.
Impression

 History of fetal demise at 22 weeks gestation.  Obstetric
 history significant for 2 term cesarean deliveries.
 In this pregnancy, on cell-free fetal DNA screening, the risks
 of fetal aneuploidies are not increased.MSAFP screening
 showed low risk for open-neural tube defects .
 Patient reports good fetal movements

 Fetal growth is appropriate for gestational age .Amniotic fluid
 is normal and good fetal activity is seen .  Placenta is anterior
 and there is no evidence of previa or placenta accreta
 spectrum.

 I reassured the patient with help of Arabic language
 interpreter present in the room.
Recommendations

 -An appointment was made for her to return in 8 weeks for
 fetal growth assessment.
 -Fetal growth assessment at 36 weeks gestation.
                 Alisma, Valerita

## 2021-08-17 NOTE — Progress Notes (Signed)
° °  Subjective:  Paula Shea is a 36 y.o. [id number] at [redacted]w[redacted]d being seen today for ongoing prenatal care.  She is currently monitored for the following issues for this low-risk pregnancy and has History of cesarean delivery; History of IUFD; and Supervision of high risk pregnancy, antepartum on their problem list.  Patient reports no complaints.  Contractions: Not present. Vag. Bleeding: None.  Movement: Present. Denies leaking of fluid.   The following portions of the patient's history were reviewed and updated as appropriate: allergies, current medications, past family history, past medical history, past social history, past surgical history and problem list. Problem list updated.  Objective:   Vitals:   08/17/21 1044  BP: 130/80  Pulse: 78  Weight: 174 lb 8 oz (79.2 kg)    Fetal Status: Fetal Heart Rate (bpm): 146   Movement: Present     General:  Alert, oriented and cooperative. Patient is in no acute distress.  Skin: Skin is warm and dry. No rash noted.   Cardiovascular: Normal heart rate noted  Respiratory: Normal respiratory effort, no problems with respiration noted  Abdomen: Soft, gravid, appropriate for gestational age. Pain/Pressure: Absent     Pelvic: Vag. Bleeding: None     Cervical exam deferred        Extremities: Normal range of motion.  Edema: None  Mental Status: Normal mood and affect. Normal behavior. Normal judgment and thought content.   Urinalysis:      Assessment and Plan:  Pregnancy: [id number] at [redacted]w[redacted]d  1. Supervision of high risk pregnancy, antepartum BP and FHR normal Discussed fasting for next visit  2. History of IUFD At 22 weeks, VBAC on 01/24/21, c/b retained placenta and PPH Antenatal testing per MFM  3. History of cesarean delivery X2, plan for RCS Discussed tubal, not interested Prefers nexplanon, problem list updated  Preterm labor symptoms and general obstetric precautions including but not limited to vaginal bleeding, contractions,  leaking of fluid and fetal movement were reviewed in detail with the patient. Please refer to After Visit Summary for other counseling recommendations.  Return in 4 weeks (on 09/14/2021) for [facility], ob visit.   Venora Maples, MD

## 2021-09-16 ENCOUNTER — Other Ambulatory Visit: Payer: Self-pay | Admitting: General Practice

## 2021-09-16 ENCOUNTER — Other Ambulatory Visit: Payer: Medicaid Other

## 2021-09-16 ENCOUNTER — Other Ambulatory Visit: Payer: Self-pay

## 2021-09-16 ENCOUNTER — Ambulatory Visit (INDEPENDENT_AMBULATORY_CARE_PROVIDER_SITE_OTHER): Payer: Medicaid Other | Admitting: Obstetrics & Gynecology

## 2021-09-16 ENCOUNTER — Encounter: Payer: Self-pay | Admitting: Obstetrics & Gynecology

## 2021-09-16 VITALS — BP 116/65 | HR 85 | Wt 181.0 lb

## 2021-09-16 DIAGNOSIS — Z23 Encounter for immunization: Secondary | ICD-10-CM | POA: Diagnosis not present

## 2021-09-16 DIAGNOSIS — O0990 Supervision of high risk pregnancy, unspecified, unspecified trimester: Secondary | ICD-10-CM | POA: Diagnosis not present

## 2021-09-16 NOTE — Patient Instructions (Signed)
Return to office for any scheduled appointments. Call the office or go to the MAU at [facility] if:  You begin to have strong, frequent contractions  Your water breaks.  Sometimes it is a big gush of fluid, sometimes it is just a trickle that keeps getting your panties wet or running down your legs  You have vaginal bleeding.  It is normal to have a small amount of spotting if your cervix was checked.   You do not feel your baby moving like normal.  If you do not, get something to eat and drink and lay down and focus on feeling your baby move.   If your baby is still not moving like normal, you should call the office or go to MAU.  Any other obstetric concerns.   

## 2021-09-16 NOTE — Progress Notes (Addendum)
? ?  PRENATAL VISIT NOTE ? ?Subjective:  ?Paula Shea is a 36 y.o. [id number] at [redacted]w[redacted]d being seen today for ongoing prenatal care.  Patient is Dari-speaking only, interpreter present for this encounter. She is currently monitored for the following issues for this high-risk pregnancy and has History of cesarean delivery; History of IUFD; and Supervision of high risk pregnancy, antepartum on their problem list. ? ?Patient reports no complaints.  Contractions: Not present. Vag. Bleeding: None.  Movement: Present. Denies leaking of fluid.  ? ?The following portions of the patient's history were reviewed and updated as appropriate: allergies, current medications, past family history, past medical history, past social history, past surgical history and problem list.  ? ?Objective:  ? ?Vitals:  ? 09/16/21 0958  ?BP: 116/65  ?Pulse: 85  ?Weight: 181 lb (82.1 kg)  ? ? ?Fetal Status: Fetal Heart Rate (bpm): 145 Fundal Height: 29 cm Movement: Present    ? ?General:  Alert, oriented and cooperative. Patient is in no acute distress.  ?Skin: Skin is warm and dry. No rash noted.   ?Cardiovascular: Normal heart rate noted  ?Respiratory: Normal respiratory effort, no problems with respiration noted  ?Abdomen: Soft, gravid, appropriate for gestational age.  Pain/Pressure: Present     ?Pelvic: Cervical exam deferred        ?Extremities: Normal range of motion.  Edema: None  ?Mental Status: Normal mood and affect. Normal behavior. Normal judgment and thought content.  ? ?Assessment and Plan:  ?Pregnancy: [id number] at [redacted]w[redacted]d ?1. History of cesarean delivery x 2 ?Message sent to surgical scheduler to schedule RCS at 39 weeks. ? ?2. [redacted] weeks gestation of pregnancy ?3. Supervision of high risk pregnancy, antepartum ?Third trimester labs, Tdap today. ?- Tdap vaccine greater than or equal to 7yo IM ?Preterm labor symptoms and general obstetric precautions including but not limited to vaginal bleeding, contractions, leaking of fluid and fetal  movement were reviewed in detail with the patient. ?Please refer to After Visit Summary for other counseling recommendations.  ? ?Return in about 2 weeks (around 09/30/2021) for OFFICE OB VISIT (MD or APP), needs Dari interpreter. ? ?Future Appointments  ?Date Time Provider [place]  ?09/30/2021  9:15 AM Renard Matter, MD [facility]  ?10/12/2021  9:15 AM [facility] NURSE [facility]  ?10/12/2021  9:30 AM [facility] US3 [facility]  ? ? ?Verita Schneiders, MD ? ?

## 2021-09-17 LAB — GLUCOSE TOLERANCE, 2 HOURS W/ 1HR
Glucose, 1 hour: 141 mg/dL (ref 70–179)
Glucose, 2 hour: 116 mg/dL (ref 70–152)
Glucose, Fasting: 81 mg/dL (ref 70–91)

## 2021-09-17 LAB — RPR: RPR Ser Ql: NONREACTIVE

## 2021-09-17 LAB — CBC
Hematocrit: 34.6 % (ref 34.0–46.6)
Hemoglobin: 11.8 g/dL (ref 11.1–15.9)
MCH: 30.6 pg (ref 26.6–33.0)
MCHC: 34.1 g/dL (ref 31.5–35.7)
MCV: 90 fL (ref 79–97)
Platelets: 250 10*3/uL (ref 150–450)
RBC: 3.85 x10E6/uL (ref 3.77–5.28)
RDW: 12.9 % (ref 11.7–15.4)
WBC: 7.8 10*3/uL (ref 3.4–10.8)

## 2021-09-17 LAB — HIV ANTIBODY (ROUTINE TESTING W REFLEX): HIV Screen 4th Generation wRfx: NONREACTIVE

## 2021-09-29 NOTE — Progress Notes (Addendum)
? ?  Subjective:  ?Paula Shea is a 36 y.o. [id number] at [redacted]w[redacted]d being seen today for ongoing prenatal care.  She is currently monitored for the following issues for this low-risk pregnancy and has History of cesarean delivery; History of IUFD; and Supervision of high risk pregnancy, antepartum on their problem list. ? ?Patient reports  heartburn .  Contractions: Not present. Vag. Bleeding: None.  Movement: Present. Denies leaking of fluid.  ? ?The following portions of the patient's history were reviewed and updated as appropriate: allergies, current medications, past family history, past medical history, past social history, past surgical history and problem list. Problem list updated. ? ?Objective:  ? ?Vitals:  ? 09/30/21 0902  ?BP: 125/74  ?Pulse: 78  ?Weight: 180 lb (81.6 kg)  ? ? ?Fetal Status: Fetal Heart Rate (bpm): 155   Movement: Present    ? ?General:  Alert, oriented and cooperative. Patient is in no acute distress.  ?Skin: Skin is warm and dry. No rash noted.   ?Cardiovascular: Normal heart rate noted  ?Respiratory: Normal respiratory effort, no problems with respiration noted  ?Abdomen: Soft, gravid, appropriate for gestational age. Pain/Pressure: Present     ?Pelvic: Vag. Bleeding: None     ?Cervical exam deferred        ?Extremities: Normal range of motion.  Edema: None  ?Mental Status: Normal mood and affect. Normal behavior. Normal judgment and thought content.  ? ? ?Assessment and Plan:  ?Pregnancy: [id number] at [redacted]w[redacted]d ? ?1. Supervision of high risk pregnancy, antepartum ?Doing well. Has some heartburn- sent Pepcid to patient's pharmacy. Has questions about fasting for Ramadan. Eats breakfast before sunrise and drinks water in the morning. Then eats after sunset. Drinks ~4 glasses of water in the whole day.  ?- follow up US scheduled on 4/11 ?- discussed hydrating more and if asymptomatic from fasting ok to fast. But also if has concerns and would like to not fast that is ok as well. Discussed that if  she does decide to fast and she has symptoms of dizziness or lightheadedness or any weakness she should stop fasting. ? ?2. History of cesarean delivery ?Two prior CS. Plan for repeat CS. Scheduled for 11/26/2021 ? ?3. [redacted] weeks gestation of pregnancy ? ?4. Language barrier ?In person interpreter present for encounter ? ?Preterm labor symptoms and general obstetric precautions including but not limited to vaginal bleeding, contractions, leaking of fluid and fetal movement were reviewed in detail with the patient. ?Please refer to After Visit Summary for other counseling recommendations.  ?Return in about 2 weeks (around 10/14/2021) for LROB. ? ? ?Warner Mccreedy, MD, MPH ?OB Fellow, Faculty Practice ? ? ?

## 2021-09-30 ENCOUNTER — Ambulatory Visit (INDEPENDENT_AMBULATORY_CARE_PROVIDER_SITE_OTHER): Payer: Medicaid Other | Admitting: Family Medicine

## 2021-09-30 VITALS — BP 125/74 | HR 78 | Wt 180.0 lb

## 2021-09-30 DIAGNOSIS — O0990 Supervision of high risk pregnancy, unspecified, unspecified trimester: Secondary | ICD-10-CM

## 2021-09-30 DIAGNOSIS — Z3A30 30 weeks gestation of pregnancy: Secondary | ICD-10-CM

## 2021-09-30 DIAGNOSIS — Z98891 History of uterine scar from previous surgery: Secondary | ICD-10-CM

## 2021-09-30 MED ORDER — FAMOTIDINE 20 MG PO TABS: 20.0000 mg | ORAL_TABLET | Freq: Every day | ORAL | 0 refills | Status: DC

## 2021-10-12 ENCOUNTER — Encounter: Payer: Self-pay | Admitting: *Deleted

## 2021-10-12 ENCOUNTER — Ambulatory Visit: Payer: Medicaid Other | Attending: Obstetrics and Gynecology

## 2021-10-12 ENCOUNTER — Ambulatory Visit: Payer: Medicaid Other | Admitting: *Deleted

## 2021-10-12 ENCOUNTER — Other Ambulatory Visit: Payer: Self-pay | Admitting: *Deleted

## 2021-10-12 VITALS — BP 119/67 | HR 78

## 2021-10-12 DIAGNOSIS — O34219 Maternal care for unspecified type scar from previous cesarean delivery: Secondary | ICD-10-CM | POA: Diagnosis not present

## 2021-10-12 DIAGNOSIS — Z3A32 32 weeks gestation of pregnancy: Secondary | ICD-10-CM

## 2021-10-12 DIAGNOSIS — O09293 Supervision of pregnancy with other poor reproductive or obstetric history, third trimester: Secondary | ICD-10-CM | POA: Diagnosis not present

## 2021-10-12 DIAGNOSIS — O0990 Supervision of high risk pregnancy, unspecified, unspecified trimester: Secondary | ICD-10-CM

## 2021-10-12 DIAGNOSIS — O283 Abnormal ultrasonic finding on antenatal screening of mother: Secondary | ICD-10-CM | POA: Diagnosis present

## 2021-10-12 DIAGNOSIS — Z8759 Personal history of other complications of pregnancy, childbirth and the puerperium: Secondary | ICD-10-CM

## 2021-10-12 DIAGNOSIS — O358XX0 Maternal care for other (suspected) fetal abnormality and damage, not applicable or unspecified: Secondary | ICD-10-CM | POA: Diagnosis not present

## 2021-10-12 IMAGING — US US MFM OB FOLLOW-UP
1 series · 13 of 28 positions shown · non-contrast
Comparison: none

[Series 1: us mfm ob follow-up · 13 of 31 slices shown]
[im 2/31]
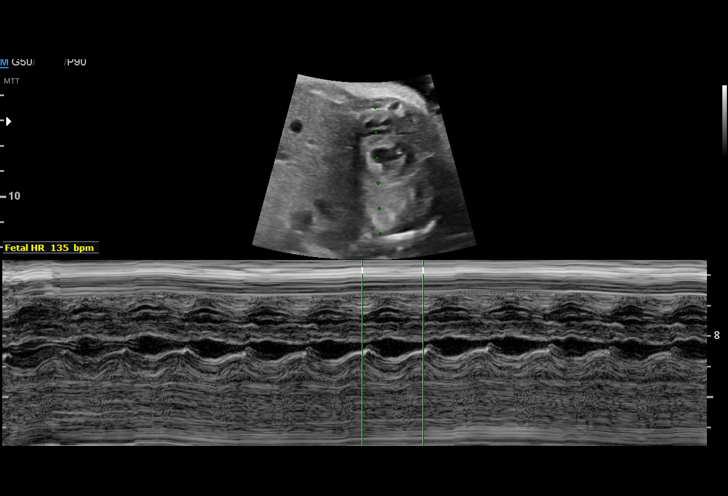
[im 4/31]
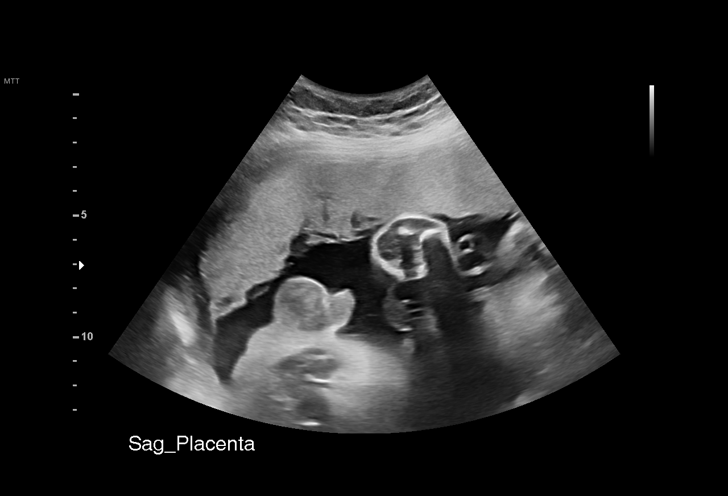
[im 6/31]
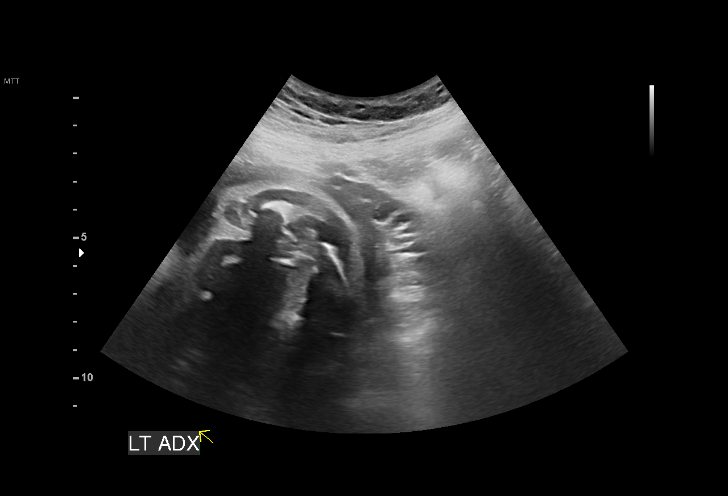
[im 8/31]
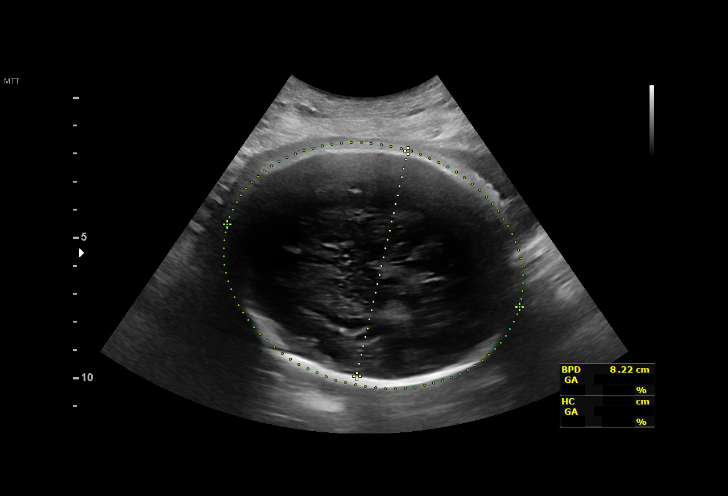
[im 11/31]
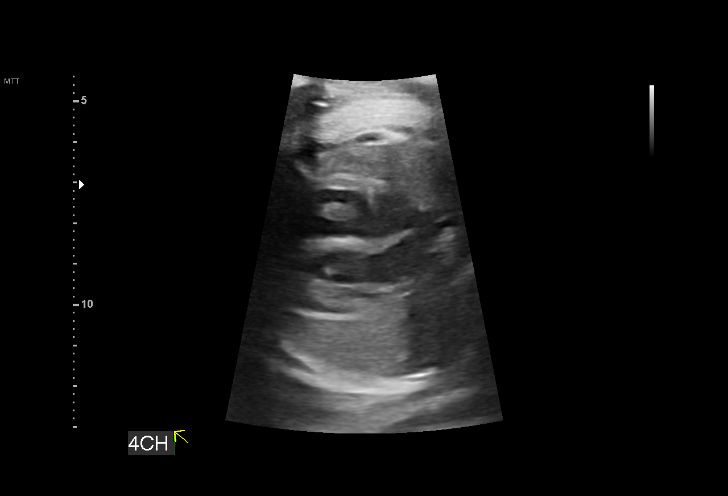
[im 13/31]
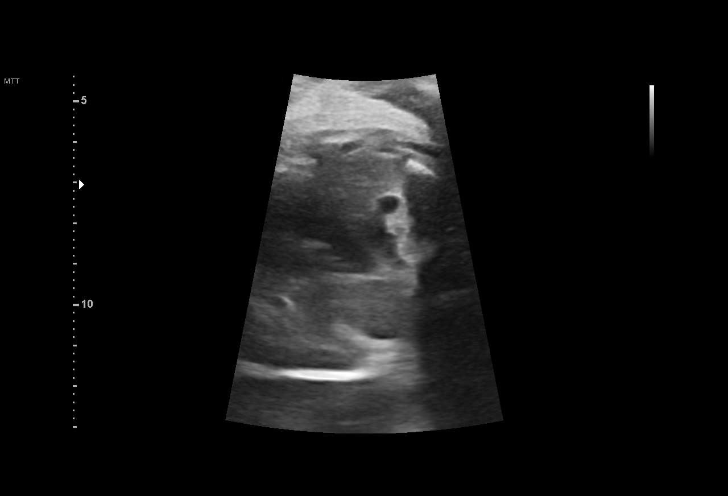
[im 16/31]
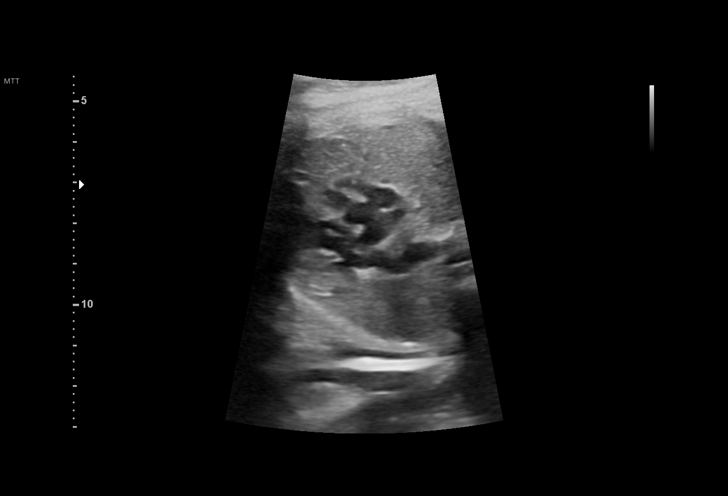
[im 18/31]
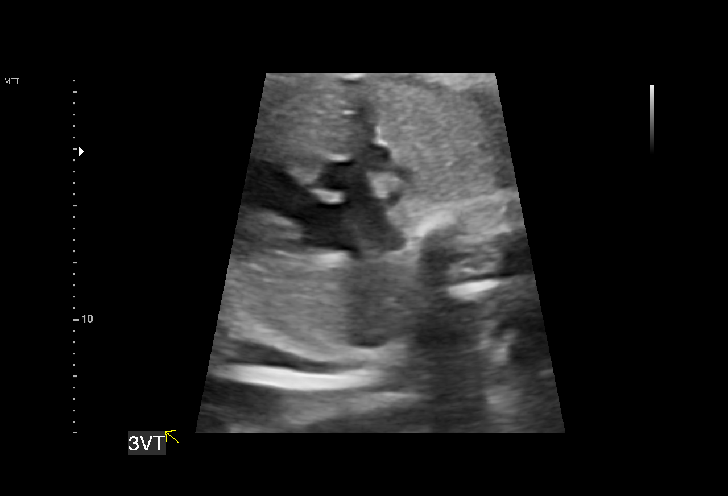
[im 21/31]
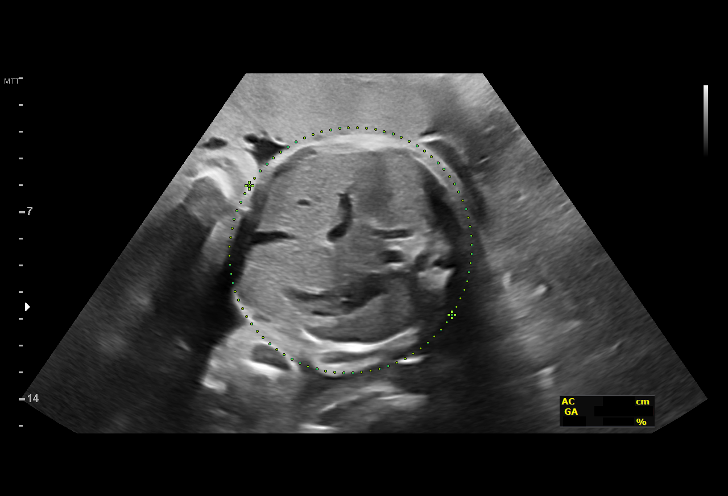
[im 23/31]
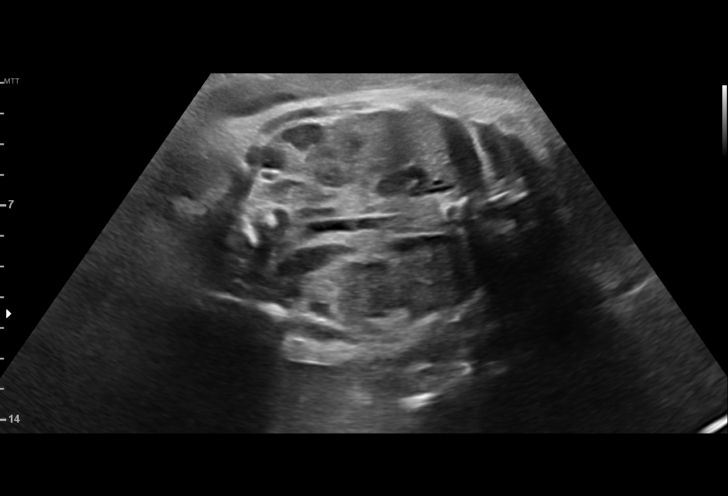
[im 25/31]
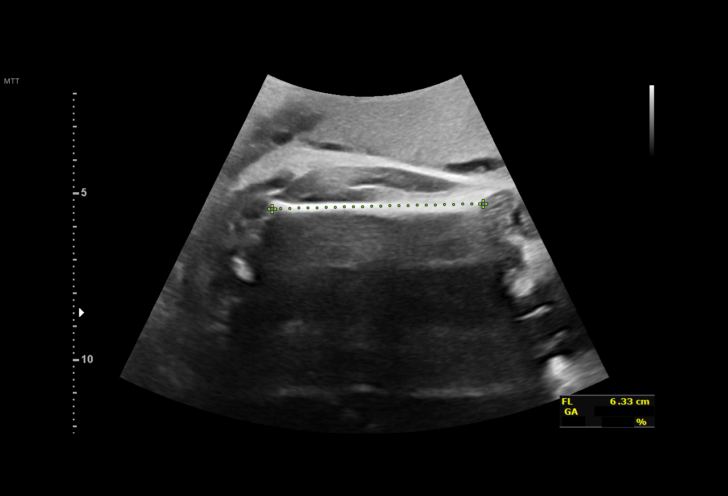
[im 27/31]
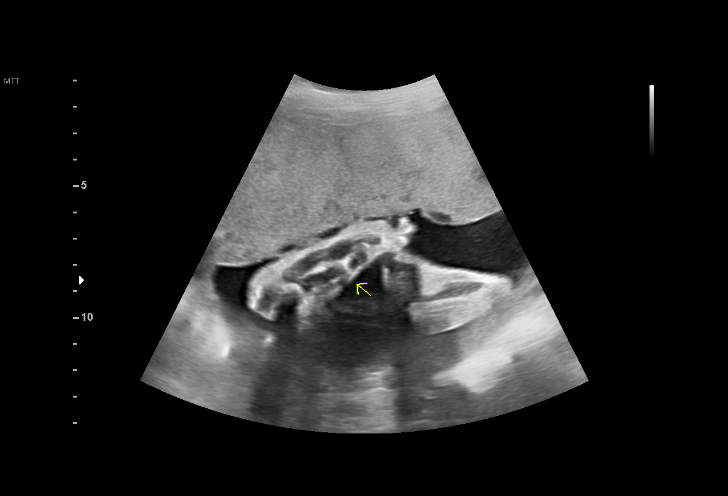
[im 29/31]
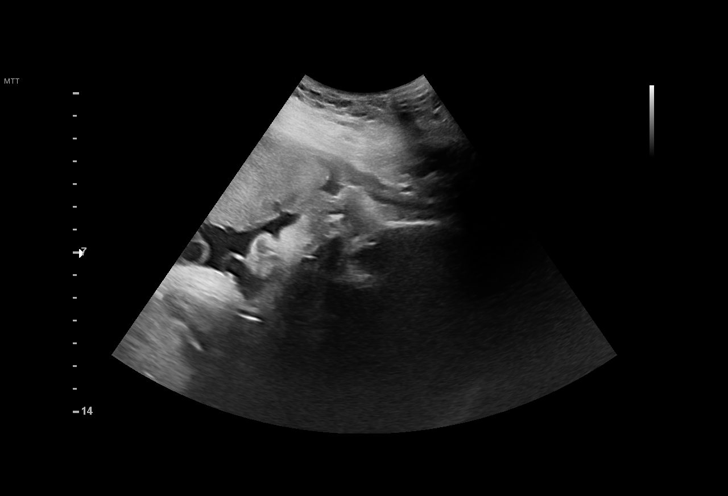

[13 of 28 positions shown; findings below may reference images not displayed]

Indications

 Poor obstetric history: Previous IUFD
 (stillbirth) 22 weeks
 Previous cesarean delivery, antepartum
 32 weeks gestation of pregnancy
 Echogenic intracardiac focus of the heart
 (EIF)
Fetal Evaluation

 Num Of Fetuses:         1
 Fetal Heart Rate(bpm):  135
 Cardiac Activity:       Observed
 Presentation:           Cephalic
 Placenta:               Anterior
 P. Cord Insertion:      Previously Visualized

 Amniotic Fluid
 AFI FV:      Within normal limits

 AFI Sum(cm)     %Tile       Largest Pocket(cm)
 15.53           55

 RUQ(cm)       RLQ(cm)       LUQ(cm)        LLQ(cm)

Biometry
 BPD:      81.9  mm     G. Age:  32w 6d         53  %    CI:        73.98   %    70 - 86
                                                         FL/HC:      21.3   %    19.9 -
 HC:      302.4  mm     G. Age:  33w 4d         39  %    HC/AC:      1.06        0.96 -
 AC:      284.3  mm     G. Age:  32w 3d         46  %    FL/BPD:     78.8   %    71 - 87
 FL:       64.5  mm     G. Age:  33w 2d         58  %    FL/AC:      22.7   %    20 - 24
 LV:        2.8  mm

 Est. FW:    4200  gm      4 lb 9 oz     49  %
OB History

 Gravidity:    4         Term:   2        Prem:   1
Gestational Age

 LMP:           29w 6d        Date:  03/17/21                 EDD:   12/22/21
 U/S Today:     33w 0d                                        EDD:   11/30/21
 Best:          32w 4d     Det. By:  Early Ultrasound         EDD:   12/03/21
                                     (05/31/21)
Anatomy

 Cranium:               Appears normal         Aortic Arch:            Previously seen
 Cavum:                 Previously seen        Ductal Arch:            Previously seen
 Ventricles:            Appears normal         Diaphragm:              Previously seen
 Choroid Plexus:        Previously seen        Stomach:                Appears normal, left
                                                                       sided
 Cerebellum:            Previously seen        Abdomen:                Previously seen
 Posterior Fossa:       Previously seen        Abdominal Wall:         Previously seen
 Nuchal Fold:           Previously seen        Cord Vessels:           Previously seen
 Face:                  Orbits and profile     Kidneys:                Appear normal
                        previously seen
 Lips:                  Previously seen        Bladder:                Appears normal
 Thoracic:              Previously seen        Spine:                  Limited views
                                                                       previously seen
 Heart:                 Appears normal         Upper Extremities:      Previously seen
                        (4CH, axis, and
                        situs)
 RVOT:                  Previously seen        Lower Extremities:      Previously seen
 LVOT:                  Previously seen

 Other:  Male gender previously seen. Nasal bone and lenses visualized
         previously.
Cervix Uterus Adnexa

 Cervix
 Not visualized (advanced GA >42wks)

 Right Ovary
 Visualized.

 Left Ovary
 Visualized.
Comments

 This patient was seen for a follow up growth scan due to a
 prior IUFD at around 22 weeks.  The patient could not explain
 what happened during that pregnancy.  She denies any
 problems since her last exam.
 She was informed that the fetal growth and amniotic fluid
 level appears appropriate for her gestational age.
 Due to her prior poor obstetrical history, we will start weekly
 fetal testing at 34 weeks.
 She will return in 2 weeks for a BPP.
 All conversations were held with the patient today with the
 help of an Arabic interpreter.

## 2021-10-15 ENCOUNTER — Ambulatory Visit (INDEPENDENT_AMBULATORY_CARE_PROVIDER_SITE_OTHER): Payer: Medicaid Other | Admitting: Family

## 2021-10-15 VITALS — Wt 182.7 lb

## 2021-10-15 DIAGNOSIS — Z8759 Personal history of other complications of pregnancy, childbirth and the puerperium: Secondary | ICD-10-CM

## 2021-10-15 DIAGNOSIS — Z98891 History of uterine scar from previous surgery: Secondary | ICD-10-CM

## 2021-10-15 DIAGNOSIS — Z3A33 33 weeks gestation of pregnancy: Secondary | ICD-10-CM | POA: Insufficient documentation

## 2021-10-15 DIAGNOSIS — O0990 Supervision of high risk pregnancy, unspecified, unspecified trimester: Secondary | ICD-10-CM

## 2021-10-15 NOTE — Progress Notes (Signed)
? ?  PRENATAL VISIT NOTE ? ?Subjective:  ?Paula Shea is a 36 y.o. [id number] at [redacted]w[redacted]d being seen today for ongoing prenatal care.  She is currently monitored for the following issues for this low-risk pregnancy and has History of cesarean delivery; History of IUFD; Supervision of high risk pregnancy, antepartum; and [redacted] weeks gestation of pregnancy on their problem list.  Main concern is getting a pack n play today.  Here with interpreter. ? ?Patient reports no complaints.  Contractions: Irritability. Vag. Bleeding: None.  Movement: Present. Denies leaking of fluid.  ? ?The following portions of the patient's history were reviewed and updated as appropriate: allergies, current medications, past family history, past medical history, past social history, past surgical history and problem list.  ? ?Objective:  ? ?Vitals:  ? 10/15/21 1041  ?Weight: 182 lb 11.2 oz (82.9 kg)  ? ? ?Fetal Status:     Movement: Present    ? ?General:  Alert, oriented and cooperative. Patient is in no acute distress.  ?Skin: Skin is warm and dry. No rash noted.   ?Cardiovascular: Normal heart rate noted  ?Respiratory: Normal respiratory effort, no problems with respiration noted  ?Abdomen: Soft, gravid, appropriate for gestational age.  Pain/Pressure: Absent     ?Pelvic: Cervical exam deferred        ?Extremities: Normal range of motion.  Edema: Trace  ?Mental Status: Normal mood and affect. Normal behavior. Normal judgment and thought content.  ? ?Assessment and Plan:  ?Pregnancy: [id number] at [redacted]w[redacted]d ?1. Supervision of high risk pregnancy, antepartum ?- Close monitoring  ? ?2. History of IUFD ?- Growth ultrasounds and begin fetal surveillance next week ? ?3. History of cesarean delivery ?- Repeat csection ? ? ?4. [redacted] weeks gestation of pregnancy ?- Continued monitoring ?- Obtain Pack n Plan ? ?Preterm labor symptoms and general obstetric precautions including but not limited to vaginal bleeding, contractions, leaking of fluid and fetal movement  were reviewed in detail with the patient. ?Please refer to After Visit Summary for other counseling recommendations.  ? ?Return in about 2 weeks (around 10/29/2021). ? ?Future Appointments  ?Date Time Provider [place]  ?10/28/2021 11:15 AM [facility] NST [facility]  ?11/04/2021 10:15 AM [facility] NST [facility]  ?11/04/2021 11:15 AM Chancy Milroy, MD [facility]  ?11/10/2021  9:15 AM [facility] NURSE [facility]  ?11/10/2021  9:30 AM [facility] US3 [facility]  ? ? ?Venia Carbon Karmen Stabs, CNM ?

## 2021-10-28 ENCOUNTER — Ambulatory Visit: Payer: Medicaid Other | Admitting: *Deleted

## 2021-10-28 ENCOUNTER — Ambulatory Visit (INDEPENDENT_AMBULATORY_CARE_PROVIDER_SITE_OTHER): Payer: Medicaid Other

## 2021-10-28 VITALS — BP 108/69 | HR 101 | Wt 185.3 lb

## 2021-10-28 DIAGNOSIS — Z8759 Personal history of other complications of pregnancy, childbirth and the puerperium: Secondary | ICD-10-CM

## 2021-10-28 IMAGING — US US FETAL BPP W/ NON-STRESS
1 series · 14 of 14 positions shown · non-contrast
Comparison: none

[Series 1: us fetal bpp w/ non-stress · 14 acquisitions, 14 frames shown]
[im 1/14]
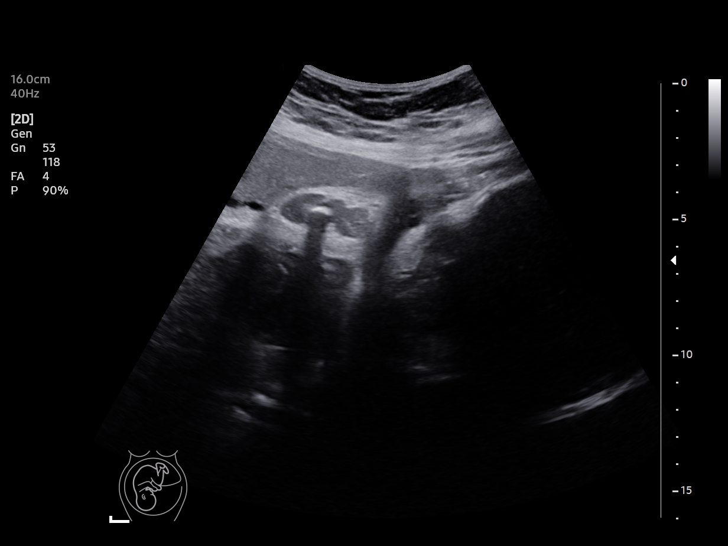
[im 2/14]
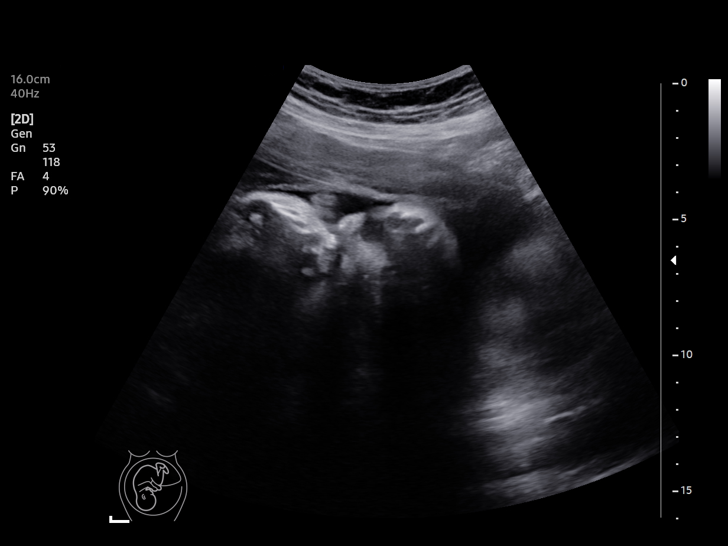
[im 3/14]
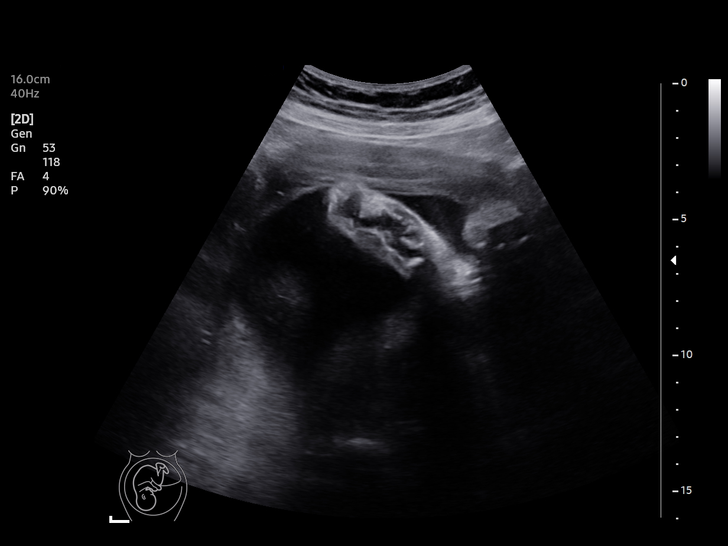
[im 4/14]
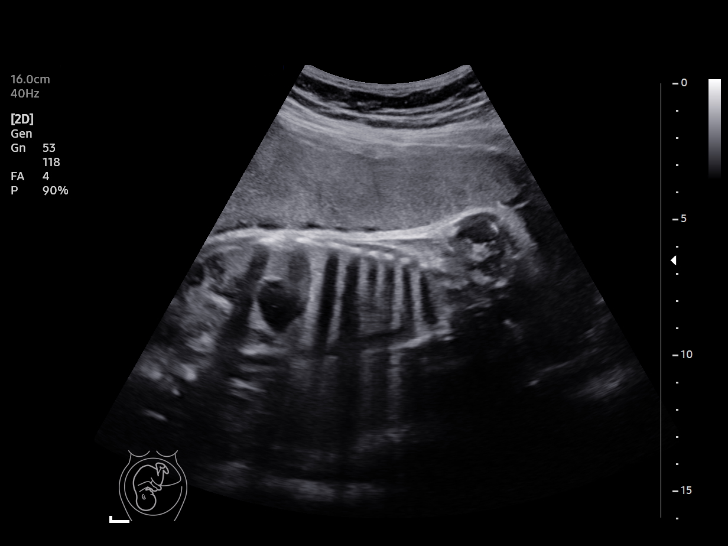
[im 5/14]
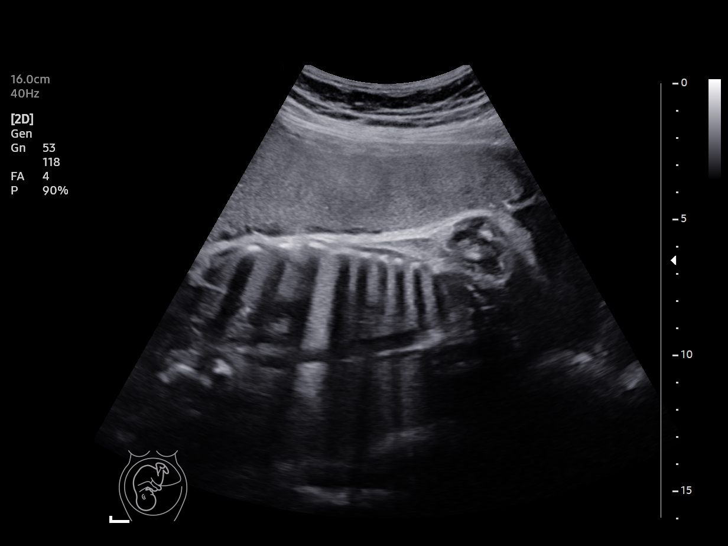
[im 6/14]
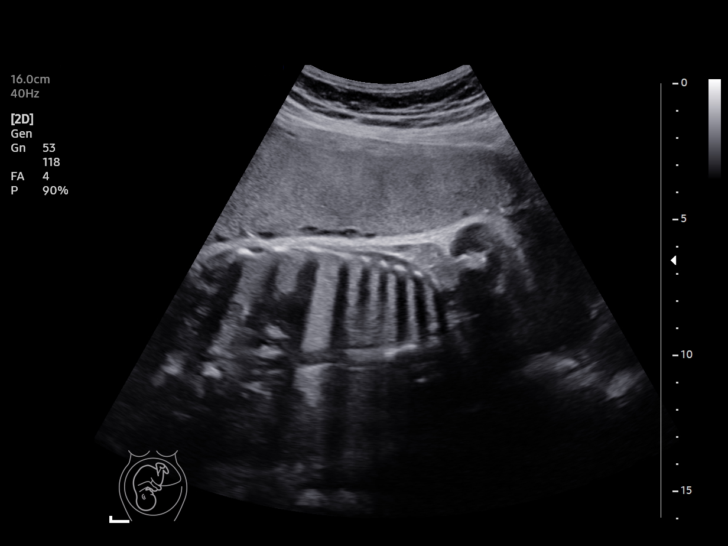
[im 7/14]
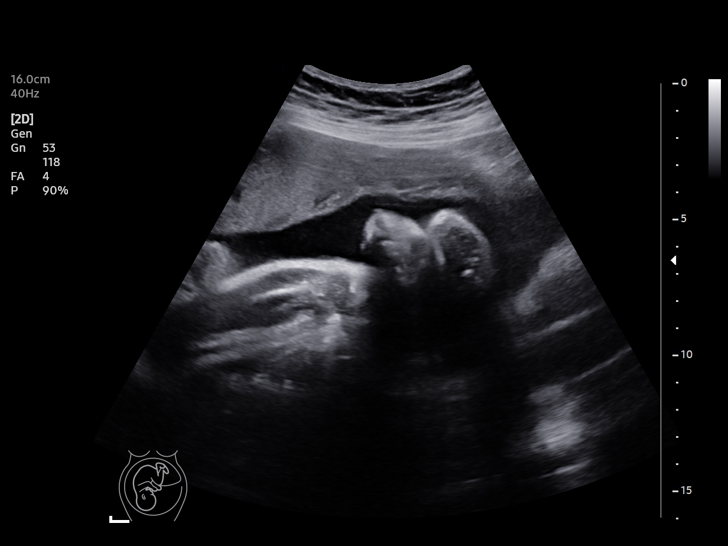
[im 8/14]
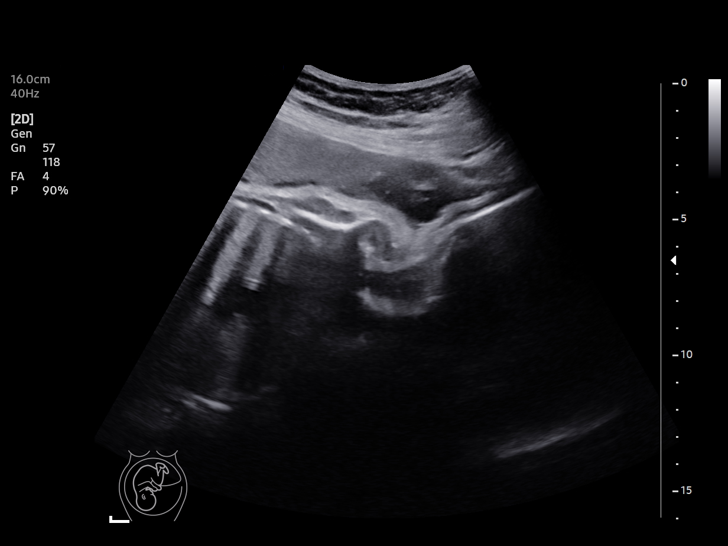
[im 9/14]
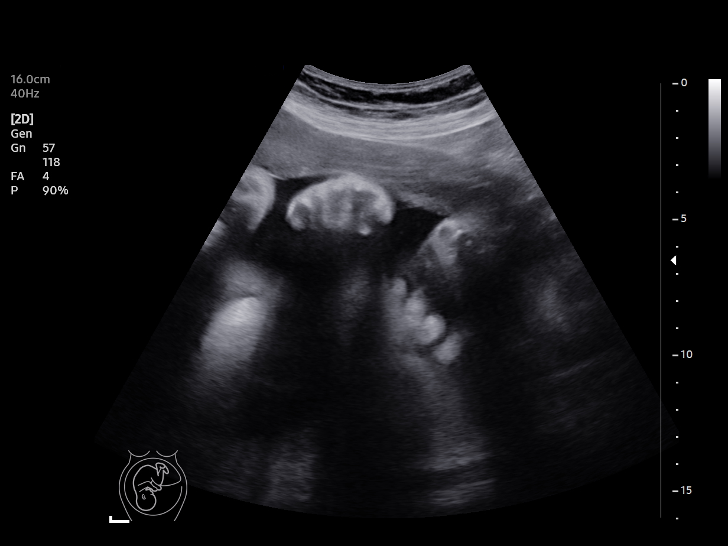
[im 10/14]
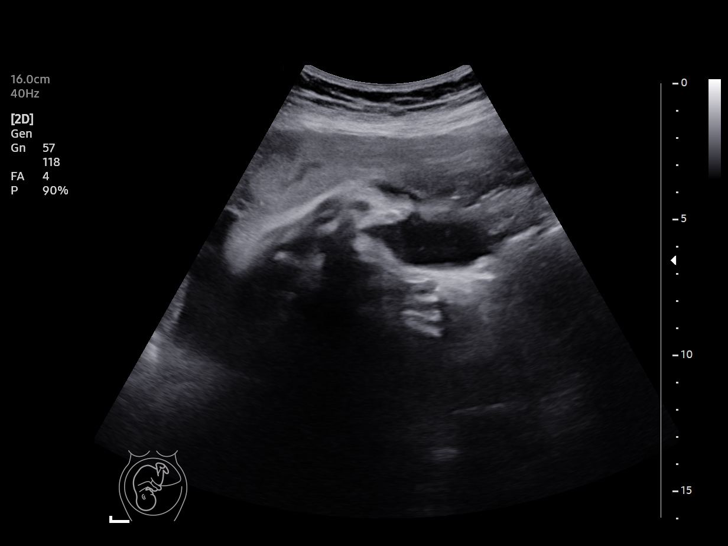
[im 11/14]
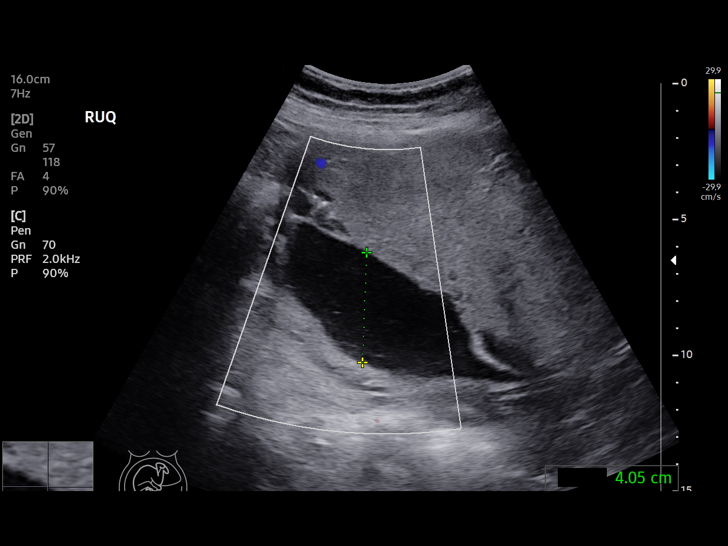
[im 12/14]
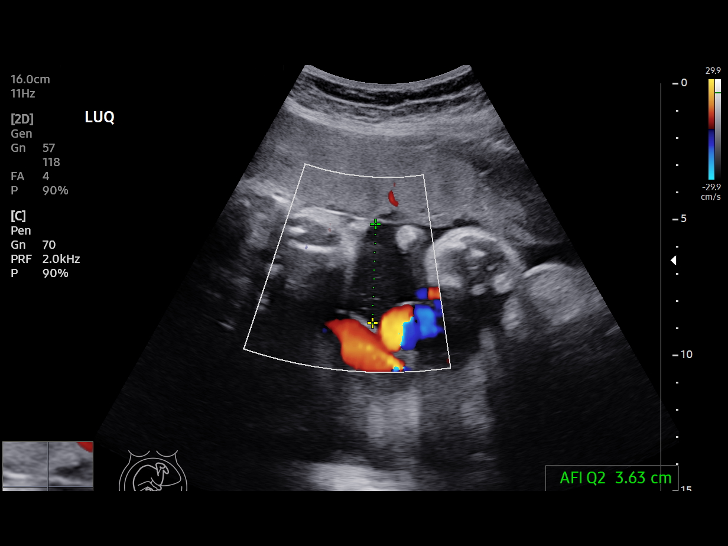
[im 13/14]
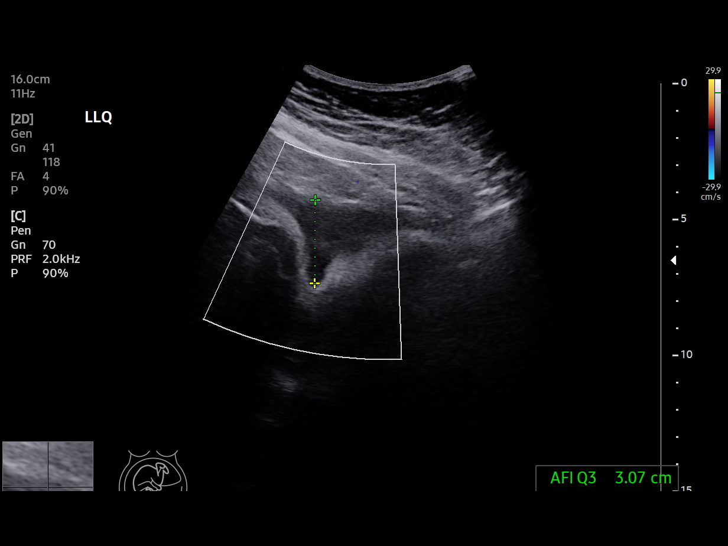
[im 14/14]
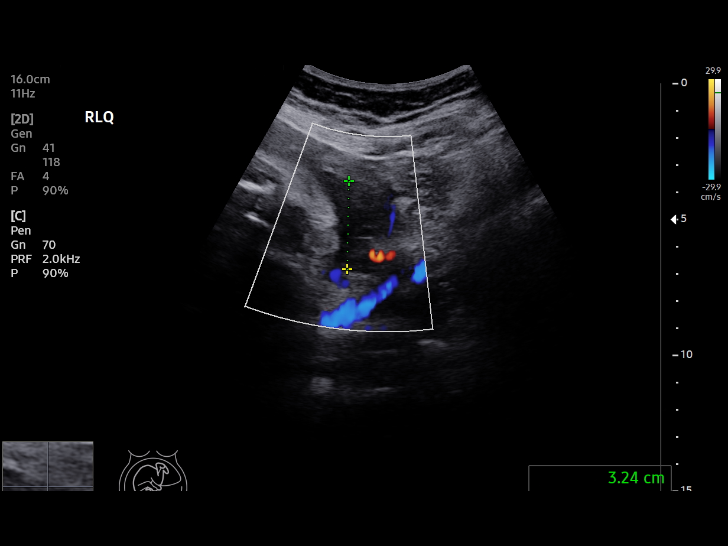

[14 of 14 positions shown; findings below may reference images not displayed]

[REDACTED]care at

 1  US FETAL BPP W/NONSTRESS              76818.4     HRICHI SIMOU

Service(s) Provided

Indications

 34 weeks gestation of pregnancy
 Poor obstetric history: Previous IUFD
 (stillbirth)
Fetal Evaluation

 Num Of Fetuses:         1
 Preg. Location:         Intrauterine
 Cardiac Activity:       Observed
 Presentation:           Cephalic

 Amniotic Fluid
 AFI FV:      Within normal limits

 AFI Sum(cm)     %Tile       Largest Pocket(cm)
 13.99           50

 RUQ(cm)       RLQ(cm)       LUQ(cm)        LLQ(cm)

Biophysical Evaluation
 Amniotic F.V:   Pocket => 2 cm             F. Tone:        Observed
 F. Movement:    Observed                   N.S.T:          Reactive
 F. Breathing:   Observed                   Score:          [DATE]
OB History

 Gravidity:    4         Term:   2        Prem:   1
Gestational Age

 LMP:           32w 1d        Date:  03/17/21                  EDD:   12/22/21
 Best:          34w 6d     Det. By:  Early Ultrasound         EDD:   12/03/21
                                     (05/31/21)
Impression

 Vertex
Recommendations

 Continue with antenatal testing as clinically indicated

## 2021-10-28 NOTE — Progress Notes (Signed)

## 2021-11-04 ENCOUNTER — Ambulatory Visit (INDEPENDENT_AMBULATORY_CARE_PROVIDER_SITE_OTHER): Payer: Medicaid Other

## 2021-11-04 ENCOUNTER — Ambulatory Visit (INDEPENDENT_AMBULATORY_CARE_PROVIDER_SITE_OTHER): Payer: Medicaid Other | Admitting: Obstetrics and Gynecology

## 2021-11-04 ENCOUNTER — Encounter: Payer: Self-pay | Admitting: Obstetrics and Gynecology

## 2021-11-04 ENCOUNTER — Ambulatory Visit (INDEPENDENT_AMBULATORY_CARE_PROVIDER_SITE_OTHER): Payer: Medicaid Other | Admitting: General Practice

## 2021-11-04 ENCOUNTER — Other Ambulatory Visit (HOSPITAL_COMMUNITY)
Admission: RE | Admit: 2021-11-04 | Discharge: 2021-11-04 | Disposition: A | Discharge status: Home / Self Care | Payer: Medicaid Other | Source: Ambulatory Visit | Attending: Obstetrics and Gynecology | Admitting: Obstetrics and Gynecology

## 2021-11-04 VITALS — BP 108/67 | HR 94 | Wt 186.0 lb

## 2021-11-04 DIAGNOSIS — Z98891 History of uterine scar from previous surgery: Secondary | ICD-10-CM

## 2021-11-04 DIAGNOSIS — O0990 Supervision of high risk pregnancy, unspecified, unspecified trimester: Secondary | ICD-10-CM | POA: Insufficient documentation

## 2021-11-04 DIAGNOSIS — Z8759 Personal history of other complications of pregnancy, childbirth and the puerperium: Secondary | ICD-10-CM

## 2021-11-04 IMAGING — US US FETAL BPP W/ NON-STRESS
1 series · 13 of 13 positions shown · non-contrast
Comparison: none

[Series 1: us fetal bpp w/ non-stress · 13 acquisitions, 13 frames shown]
[im 1/13]
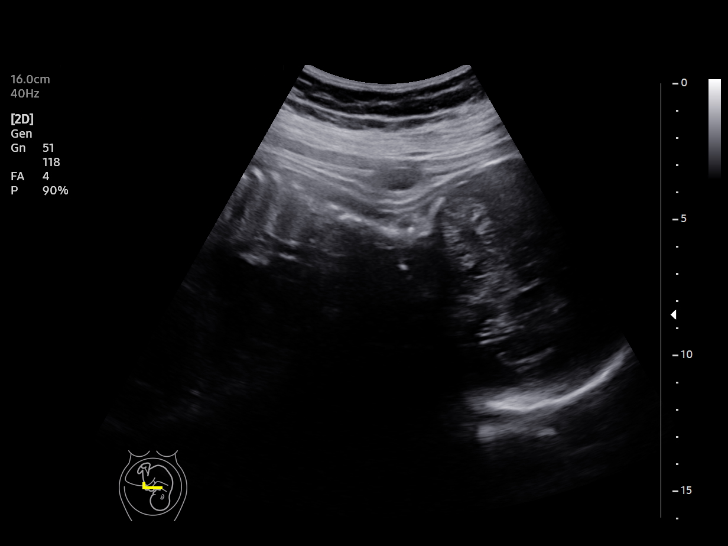
[im 2/13]
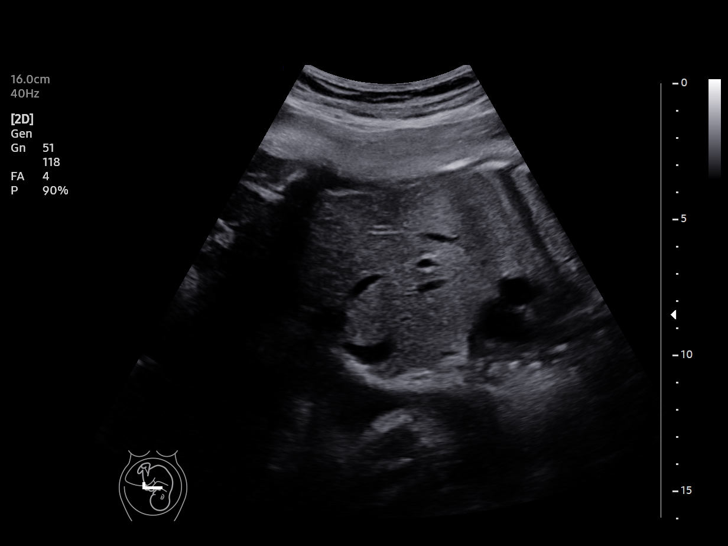
[im 3/13]
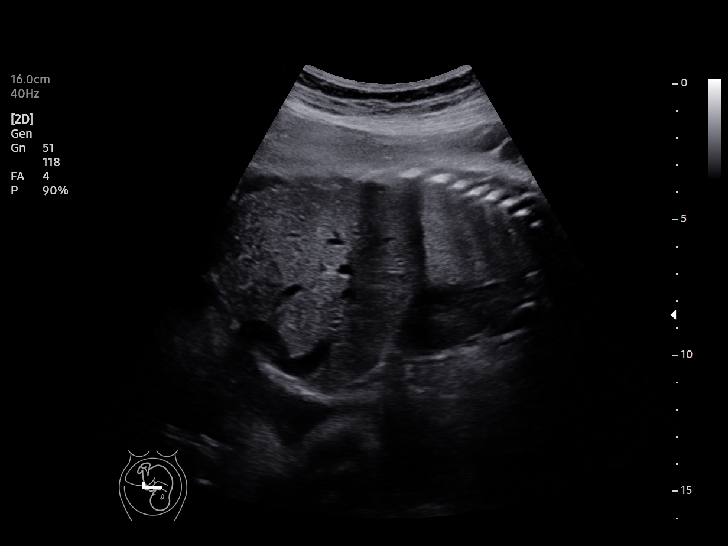
[im 4/13]
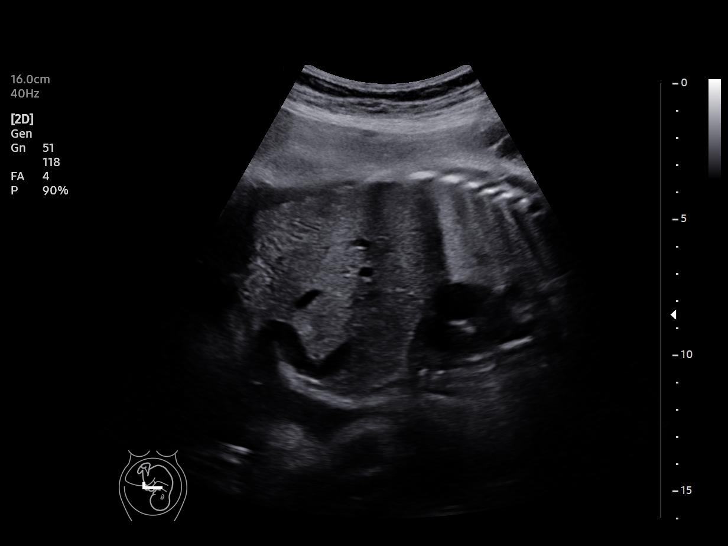
[im 5/13]
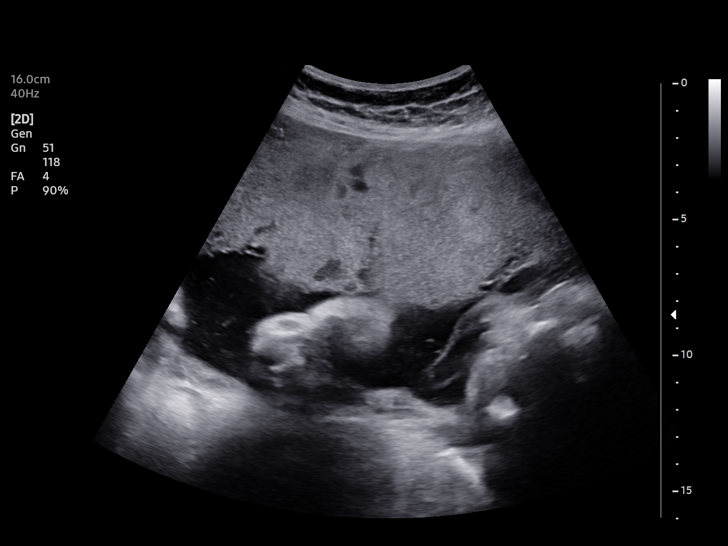
[im 6/13]
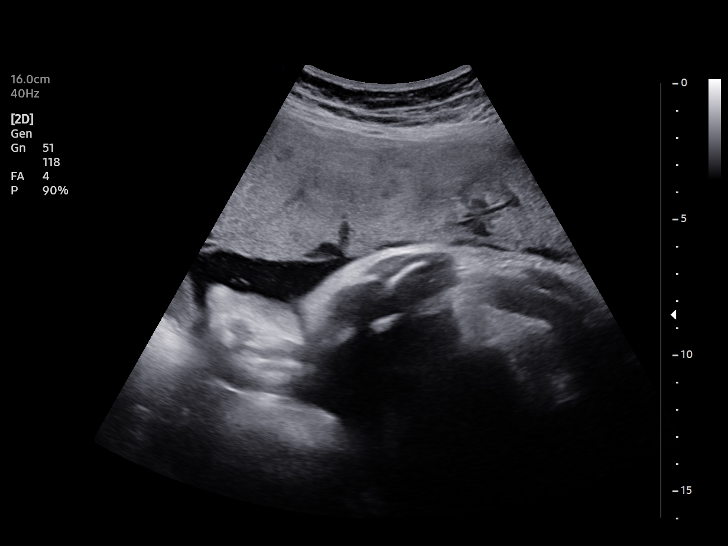
[im 7/13]
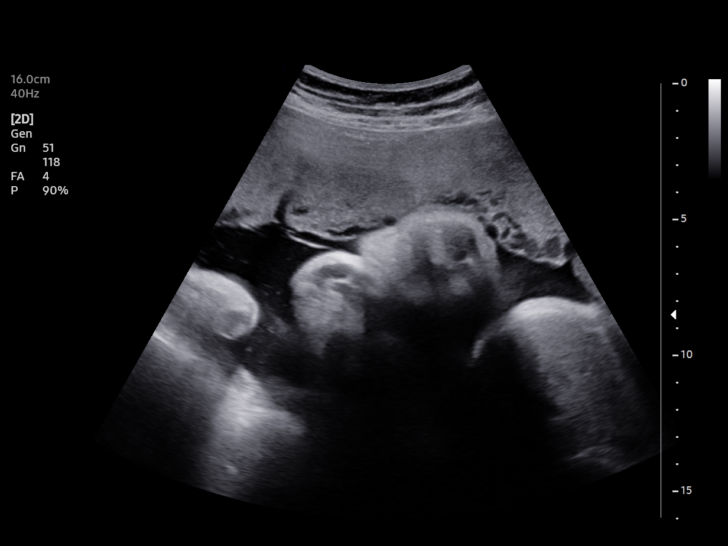
[im 8/13]
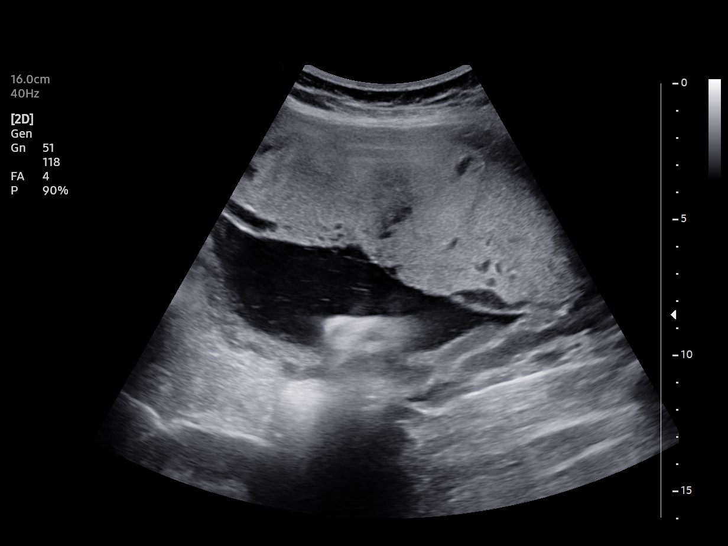
[im 9/13]
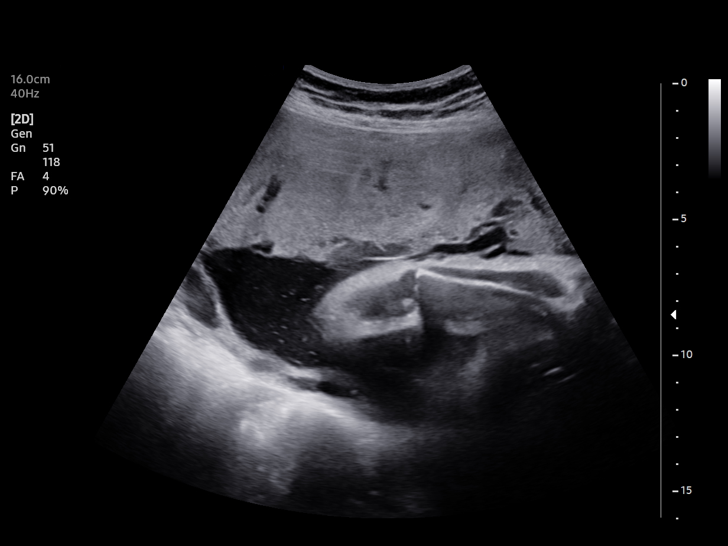
[im 10/13]
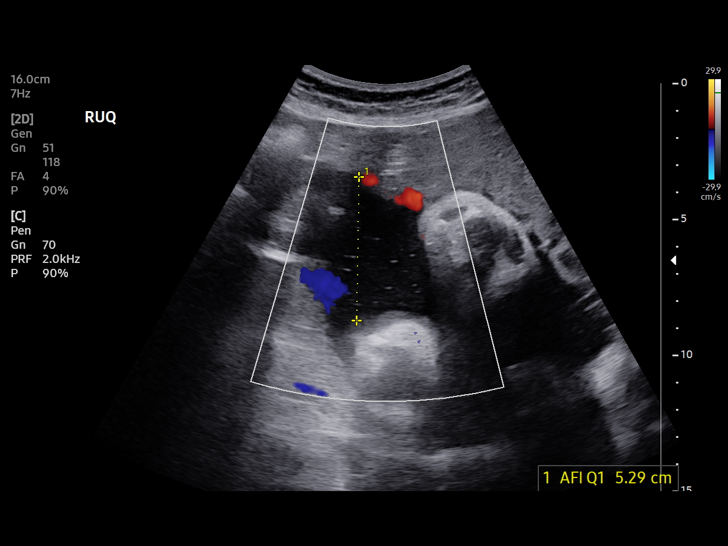
[im 11/13]
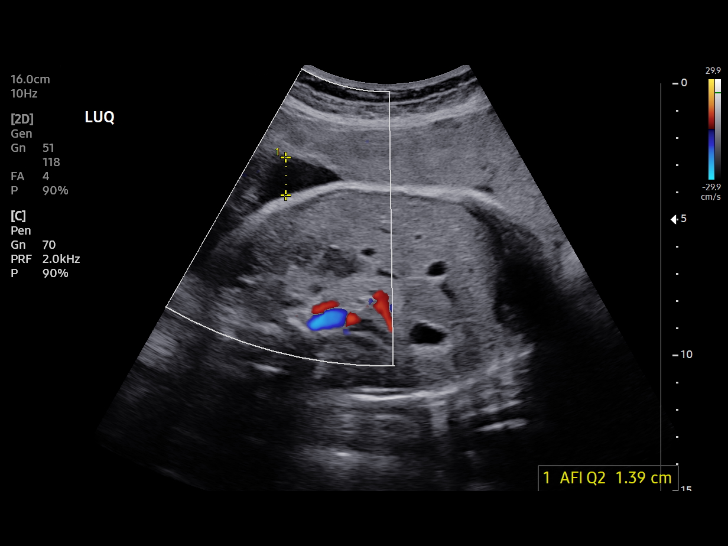
[im 12/13]
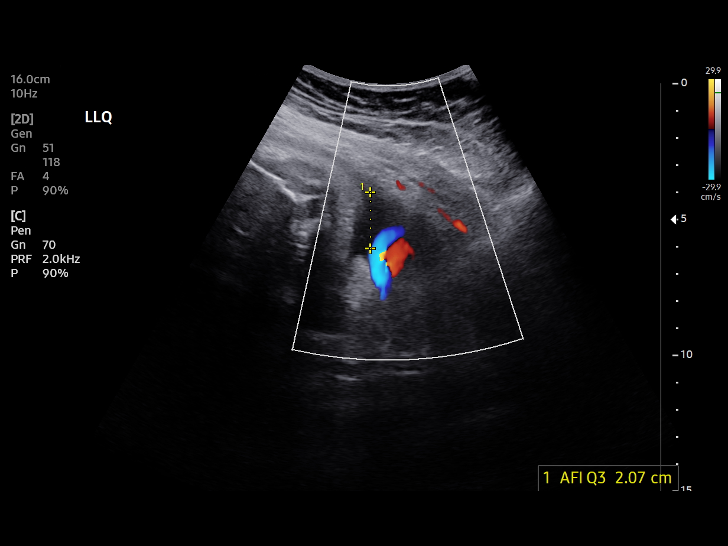
[im 13/13]
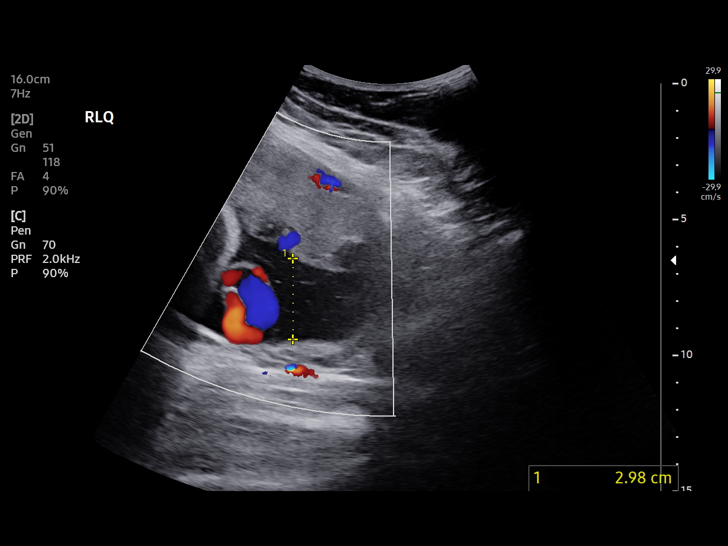

[13 of 13 positions shown; findings below may reference images not displayed]

[REDACTED]care at

 1  US FETAL BPP W/NONSTRESS              76818.4     NATHANAEL MOCK

Indications

 35 weeks gestation of pregnancy
 Poor obstetric history: Previous IUFD
 (stillbirth)
Fetal Evaluation

 Num Of Fetuses:          1
 Preg. Location:          Intrauterine
 Cardiac Activity:        Observed
 Fetal Lie:               Maternal left side
 Presentation:            Cephalic

 Amniotic Fluid
 AFI FV:      Within normal limits

 AFI Sum(cm)     %Tile       Largest Pocket(cm)
 11.73           35

 RUQ(cm)       RLQ(cm)       LUQ(cm)        LLQ(cm)

Biophysical Evaluation

 Amniotic F.V:   Pocket => 2 cm             F. Tone:         Observed
 F. Movement:    Observed                   N.S.T:           Reactive
 F. Breathing:   Observed                   Score:           [DATE]
OB History

 Gravidity:    4         Term:   2        Prem:   1
Gestational Age

 LMP:           33w 1d        Date:  03/17/21                  EDD:   12/22/21
 Best:          35w 6d     Det. By:  Early Ultrasound         EDD:   12/03/21
                                     (05/31/21)
Impression

 Vertex
Recommendations

 Continue with antenatal testing as clinically indicated

## 2021-11-04 NOTE — Patient Instructions (Signed)
Cesarean Delivery, Care After ?The following information offers guidance on how to care for yourself after your procedure. Your health care provider may also give you more specific instructions. If you have problems or questions, contact your health care provider. ?What can I expect after the procedure? ?After the procedure, it is common to have: ?A small amount of blood or clear fluid coming from the incision. ?Some redness, swelling, and pain in your incision area. ?Some abdominal pain and soreness. ?Vaginal bleeding (lochia). Even though you did not have a vaginal delivery, you will still have vaginal bleeding and discharge. ?Pelvic cramps. ?Fatigue. ?You may have pain, swelling, and discomfort in the tissue between your vagina and your anus (perineum) if: ?Your C-section was unplanned, and you were allowed to labor and push. ?An incision was made in the area (episiotomy) or the tissue tore during attempted vaginal delivery. ?Follow these instructions at home: ?Medicines ?Take over-the-counter and prescription medicines only as told by your health care provider. ?If you were prescribed an antibiotic medicine, take it as told by your health care provider. Do not stop taking the antibiotic even if you start to feel better. ?Ask your health care provider if the medicine prescribed to you requires you to avoid driving or using machinery. ?Incision care ? ?Follow instructions from your health care provider about how to take care of your incision. Make sure you: ?Wash your hands with soap and water for an least 20 seconds before and after you change your bandage (dressing). If soap and water are not available, use hand sanitizer. ?If you have a dressing, change it or remove it as told by your health care provider. ?Leave stitches (sutures), skin staples, skin glue, or adhesive strips in place. These skin closures may need to stay in place for 2 weeks or longer. If adhesive strip edges start to loosen and curl up, you  may trim the loose edges. Do not remove adhesive strips completely unless your health care provider tells you to do that. ?Check your incision area every day for signs of infection. Check for: ?More redness, swelling, or pain. ?More fluid or blood. ?Warmth. ?Pus or a bad smell. ?Do not take baths, swim, or use a hot tub until your health care provider approves. Ask your health care provider if you may take showers. ?When you cough or sneeze, hug a pillow. This helps with pain and decreases the chance of your incision opening up (dehiscing). Do this until your incision heals. ?Managing constipation ?Your procedure may cause constipation. To prevent or treat constipation, you may need to: ?Drink enough fluid to keep your urine pale yellow. ?Take over-the-counter or prescription medicines. ?Eat foods that are high in fiber, such as beans, whole grains, and fresh fruits and vegetables. ?Limit foods that are high in fat and processed sugars, such as fried or sweet foods. ?Activity ? ?If possible, have someone help you care for your baby and help with household activities for at least a few days after you leave the hospital. ?Rest as much as possible. Try to rest or take a nap while your baby is sleeping. ?You may have to avoid lifting. Ask your health care provider how much you can safely lift. ?Return to your normal activities as told by your health care provider. Ask your health care provider what activities are safe for you. ?Talk with your health care provider about when you can engage in sexual activity. This may depend on your: ?Risk of infection. ?How fast you heal. ?Comfort  and desire to engage in sexual activity. ?Lifestyle ?Do not drink alcohol. This is especially important if you are breastfeeding or taking pain medicine. ?Do not use any products that contain nicotine or tobacco. These products include cigarettes, chewing tobacco, and vaping devices, such as e-cigarettes. If you need help quitting, ask your  health care provider. ?General instructions ?Do not use tampons or douches until your health care provider approves. ?Wear loose, comfortable clothing and a supportive and well-fitting bra. ?If you pass a blood clot, save it and call your health care provider to discuss. Do not flush blood clots down the toilet before you get instructions from your health care provider. ?Keep all follow-up visits for you and your baby. This is important. ?Contact a health care provider if: ?You have: ?A fever. ?Dizziness or light-headedness. ?Bad-smelling vaginal discharge. ?A blood clot pass from your vagina. ?Pus, blood, or a bad smell coming from your incision. ?An incision that feels warm to the touch. ?More redness, swelling, or pain around your incision. ?Difficulty or pain when urinating. ?Nausea or vomiting. ?Little or no interest in activities you used to enjoy. ?Your breasts turn red or become painful or hard. ?You feel unusually sad or worried. ?You have questions about caring for yourself or your baby. ?You have redness, swelling, and pain in an arm or leg. ?Get help right away if: ?You have: ?Pain that does not go away or get better with medicine. ?Chest pain. ?Trouble breathing. ?Blurred vision, spots, or flashing lights in your vision. ?Thoughts about hurting yourself or your baby. ?New pain in your abdomen or in one of your legs. ?A severe headache that does not get better with pain medicine. ?You faint. ?You bleed from your vagina so much that you fill more than one sanitary pad in one hour. Bleeding should not be heavier than your heaviest period. ?These symptoms may be an emergency. Get help right away. Call 911. ?Do not wait to see if the symptoms will go away. ?Do not drive yourself to the hospital. ?Get help right away if you feel like you may hurt yourself or others, or have thoughts about taking your own life. Go to your nearest emergency room or: ?Call 911. ?Call the National Suicide Prevention Lifeline at  (938) 022-4870 or 988. This is open 24 hours a day. ?Text the Crisis Text Line at (432) 579-7755. ?Summary ?After the procedure, it is common to have pain at your incision site, abdominal cramping, and slight bleeding from your vagina. ?Check your incision area every day for signs of infection. ?Tell your health care provider about any unusual symptoms. ?Keep all follow-up visits for you and your baby. This is important. ?This information is not intended to replace advice given to you by your health care provider. Make sure you discuss any questions you have with your health care provider. ?Document Revised: 01/20/2021 Document Reviewed: 01/20/2021 ?Elsevier Patient Education ? 2023 [employer] ?Cesarean Delivery ?Cesarean birth, or cesarean delivery, is the surgical delivery of a baby through an incision in the abdomen and the uterus. This may be referred to as a C-section. This procedure may be scheduled ahead of time, or it may be done in an emergency situation. ?Tell a health care provider about: ?Any allergies you have. ?All medicines you are taking, including vitamins, herbs, eye drops, creams, and over-the-counter medicines. ?Any problems you or family members have had with anesthetic medicines. ?Any bleeding problems you have. ?Any surgeries you have had. ?Any medical conditions you have. ?Whether you or  any members of your family have a history of deep vein thrombosis (DVT) or pulmonary embolism (PE). ?What are the risks? ?Generally, this is a safe procedure. However, problems may occur, including: ?Infection. ?Bleeding. ?Allergic reactions to medicines. ?Damage to other structures or organs. ?Blood clots. ?Injury to your baby. ?What happens before the procedure? ?Medicines ?Ask your health care provider about: ?Changing or stopping your regular medicines. This is especially important if you are taking diabetes medicines or blood thinners. ?Taking medicines such as aspirin and ibuprofen. These medicines can thin  your blood. Do not take these medicines unless your health care provider tells you to take them. ?Taking over-the-counter medicines, vitamins, herbs, and supplements. ?Tests ?Depending on the reason for y

## 2021-11-04 NOTE — Progress Notes (Signed)
Pt informed that the ultrasound is considered a limited OB ultrasound and is not intended to be a complete ultrasound exam.  Patient also informed that the ultrasound is not being completed with the intent of assessing for fetal or placental anomalies or any pelvic abnormalities.  Explained that the purpose of today?s ultrasound is to assess for  BPP, presentation, and AFI.  Patient acknowledges the purpose of the exam and the limitations of the study.    ? ?Gwendolyne Welford H RN BSN ?11/04/21 ? ?

## 2021-11-04 NOTE — Progress Notes (Signed)
Subjective:  ?Karlita Pak is a 36 y.o. [id number] at [redacted]w[redacted]d being seen today for ongoing prenatal care.  She is currently monitored for the following issues for this high-risk pregnancy and has History of cesarean delivery; History of IUFD; and Supervision of high risk pregnancy, antepartum on their problem list. ? ?Patient reports no complaints.  Contractions: Not present. Vag. Bleeding: None.  Movement: Present. Denies leaking of fluid.  ? ?The following portions of the patient's history were reviewed and updated as appropriate: allergies, current medications, past family history, past medical history, past social history, past surgical history and problem list. Problem list updated. ? ?Objective:  ? ?Vitals:  ? 11/04/21 1023  ?BP: 108/67  ?Pulse: 94  ?Weight: 186 lb (84.4 kg)  ? ? ?Fetal Status: Fetal Heart Rate (bpm): NST   Movement: Present    ? ?General:  Alert, oriented and cooperative. Patient is in no acute distress.  ?Skin: Skin is warm and dry. No rash noted.   ?Cardiovascular: Normal heart rate noted  ?Respiratory: Normal respiratory effort, no problems with respiration noted  ?Abdomen: Soft, gravid, appropriate for gestational age. Pain/Pressure: Absent     ?Pelvic:  Cervical exam performed        ?Extremities: Normal range of motion.  Edema: None  ?Mental Status: Normal mood and affect. Normal behavior. Normal judgment and thought content.  ? ?Urinalysis:     ? ?Assessment and Plan:  ?Pregnancy: [id number] at [redacted]w[redacted]d ? ?1. Supervision of high risk pregnancy, antepartum ?Stable ?- Culture, beta strep (group b only) ?- Cervicovaginal ancillary only( [facility]) ? ?2. History of cesarean delivery ?For repeat on 11/26/21 ? ?3. History of IUFD ?Continue with serial growth scans and antenatal testing as per protocol ? ?Term labor symptoms and general obstetric precautions including but not limited to vaginal bleeding, contractions, leaking of fluid and fetal movement were reviewed in detail with the  patient. ?Please refer to After Visit Summary for other counseling recommendations.  ?Return in about 1 week (around 11/11/2021) for OB visit, face to face, any provider. ? ? ?Hermina Staggers, MD ?

## 2021-11-05 LAB — CERVICOVAGINAL ANCILLARY ONLY
Chlamydia: NEGATIVE
Comment: NEGATIVE
Comment: NORMAL
Neisseria Gonorrhea: NEGATIVE

## 2021-11-07 LAB — CULTURE, BETA STREP (GROUP B ONLY): Strep Gp B Culture: POSITIVE — AB

## 2021-11-08 ENCOUNTER — Encounter: Payer: Self-pay | Admitting: Obstetrics and Gynecology

## 2021-11-08 DIAGNOSIS — O99820 Streptococcus B carrier state complicating pregnancy: Secondary | ICD-10-CM | POA: Insufficient documentation

## 2021-11-10 ENCOUNTER — Encounter: Payer: Self-pay | Admitting: *Deleted

## 2021-11-10 ENCOUNTER — Ambulatory Visit: Payer: Medicaid Other | Attending: Obstetrics

## 2021-11-10 ENCOUNTER — Ambulatory Visit: Payer: Medicaid Other | Admitting: *Deleted

## 2021-11-10 VITALS — BP 122/68 | HR 81

## 2021-11-10 DIAGNOSIS — O09293 Supervision of pregnancy with other poor reproductive or obstetric history, third trimester: Secondary | ICD-10-CM | POA: Diagnosis not present

## 2021-11-10 DIAGNOSIS — Z8759 Personal history of other complications of pregnancy, childbirth and the puerperium: Secondary | ICD-10-CM | POA: Insufficient documentation

## 2021-11-10 DIAGNOSIS — O358XX0 Maternal care for other (suspected) fetal abnormality and damage, not applicable or unspecified: Secondary | ICD-10-CM | POA: Diagnosis not present

## 2021-11-10 DIAGNOSIS — O0990 Supervision of high risk pregnancy, unspecified, unspecified trimester: Secondary | ICD-10-CM | POA: Diagnosis present

## 2021-11-10 DIAGNOSIS — Z3A36 36 weeks gestation of pregnancy: Secondary | ICD-10-CM

## 2021-11-10 IMAGING — US US MFM OB FOLLOW-UP
1 series · 14 of 28 positions shown · non-contrast
Comparison: none

[Series 1: us mfm ob follow-up · 44 acquisitions, 14 frames shown]
[im 2/44]
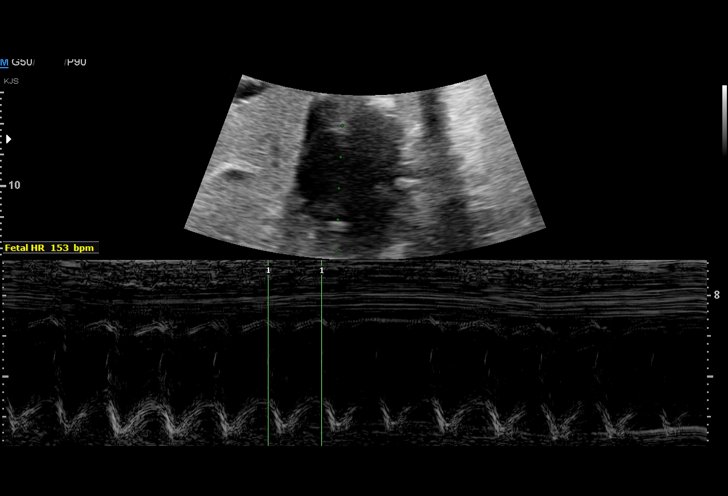
[im 5/44]
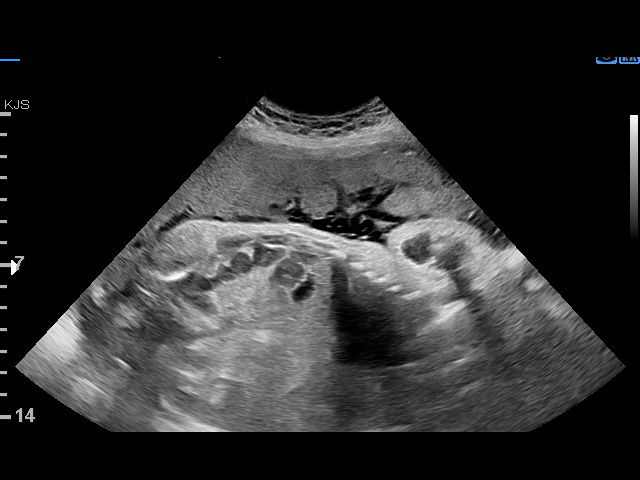
[im 8/44]
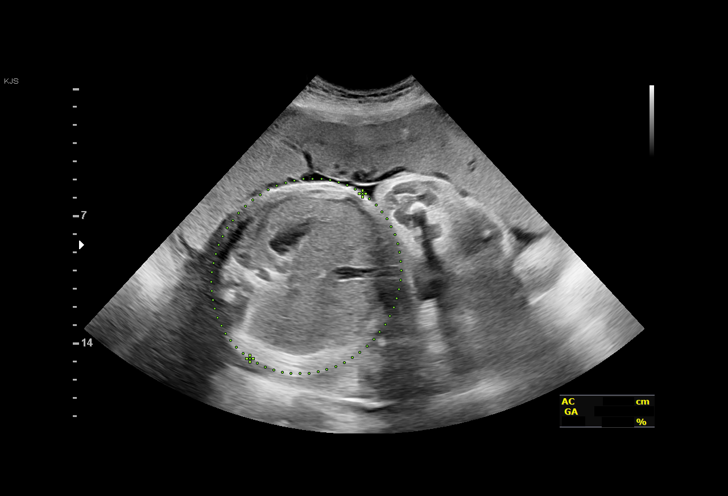
[im 12/44]
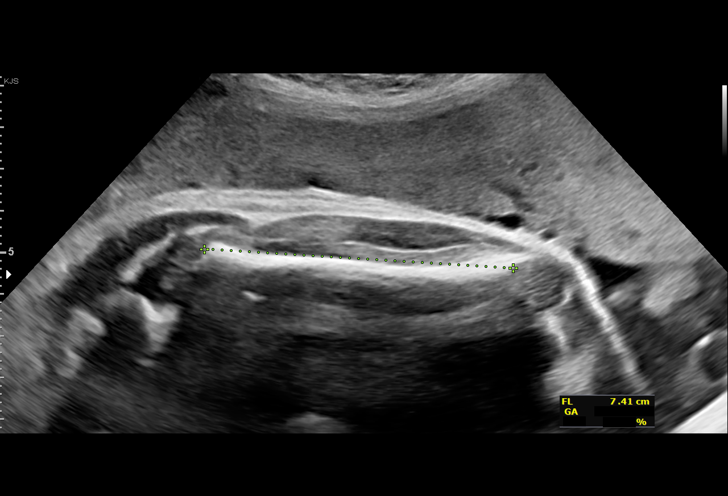
[im 15/44]
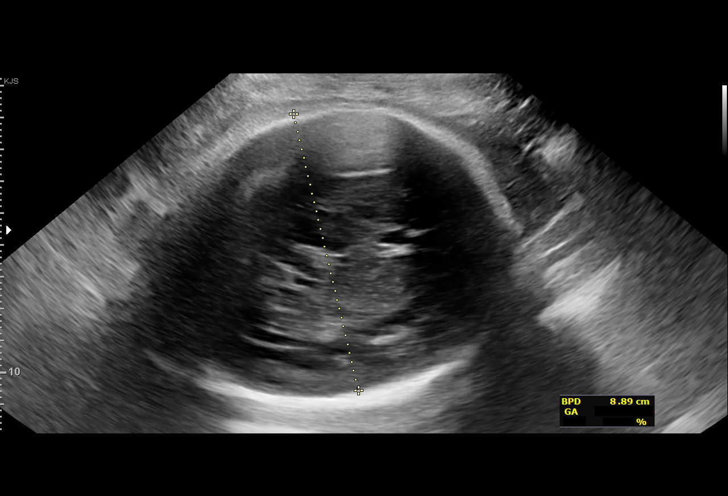
[im 18/44]
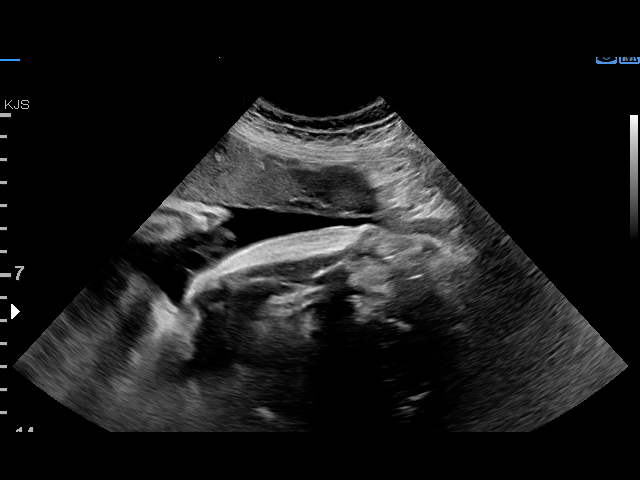
[im 21/44]
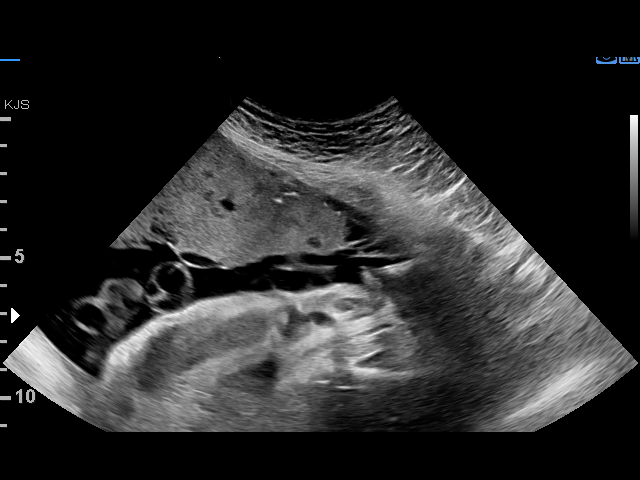
[im 24/44]
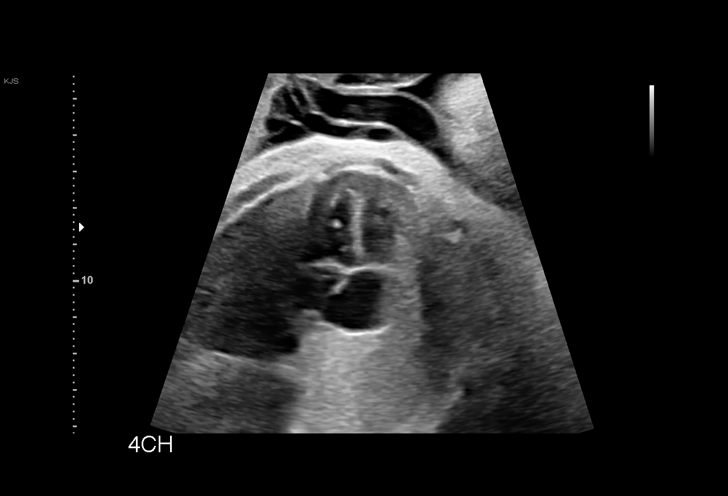
[im 28/44]
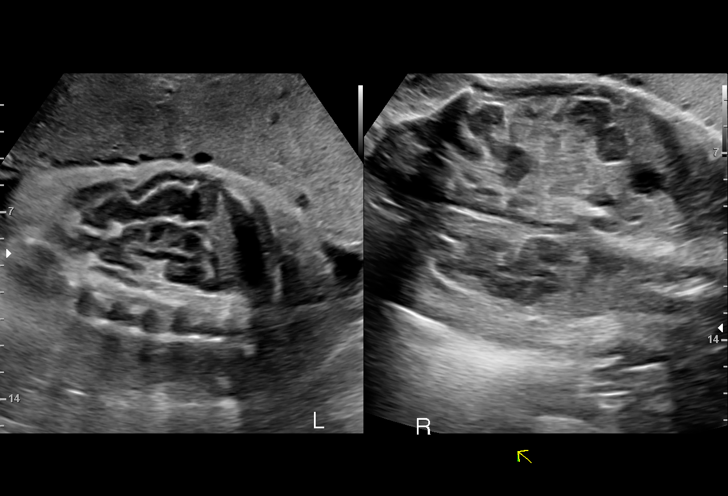
[im 31/44]
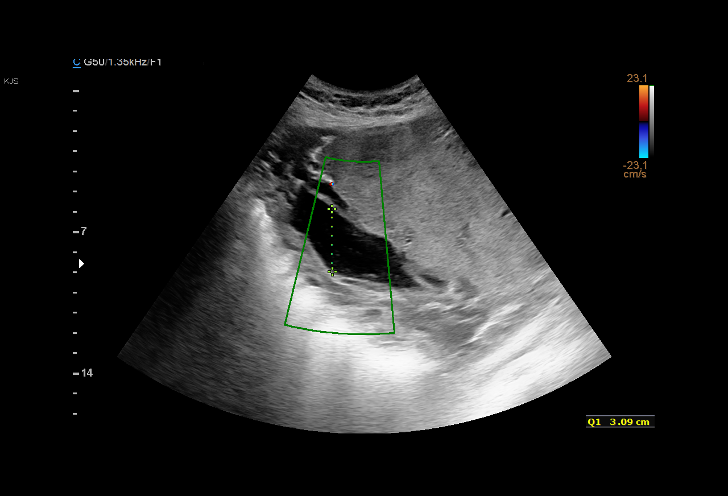
[im 34/44]
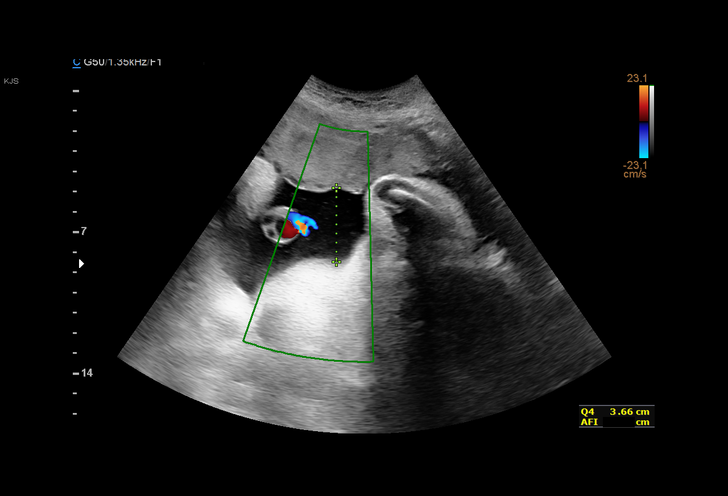
[im 37/44]
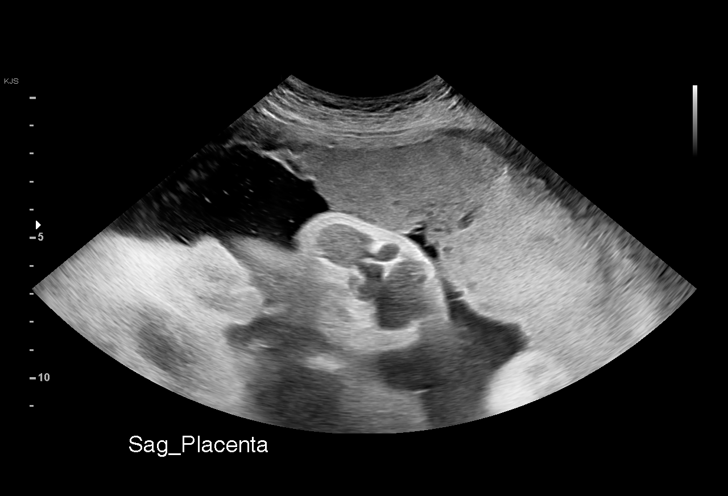
[im 40/44]
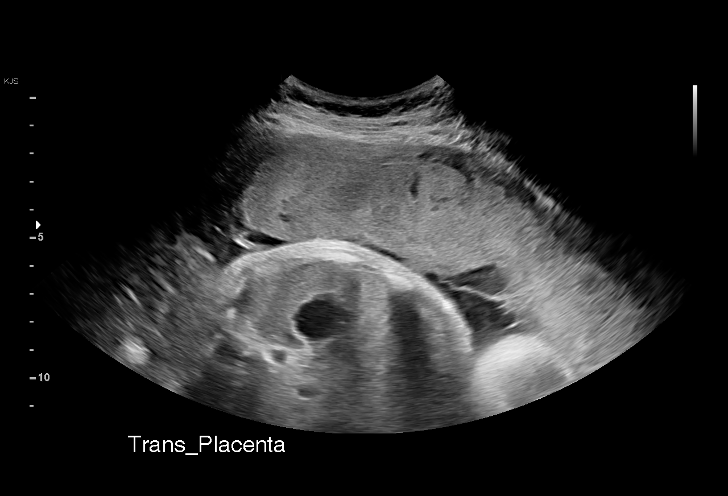
[im 44/44]
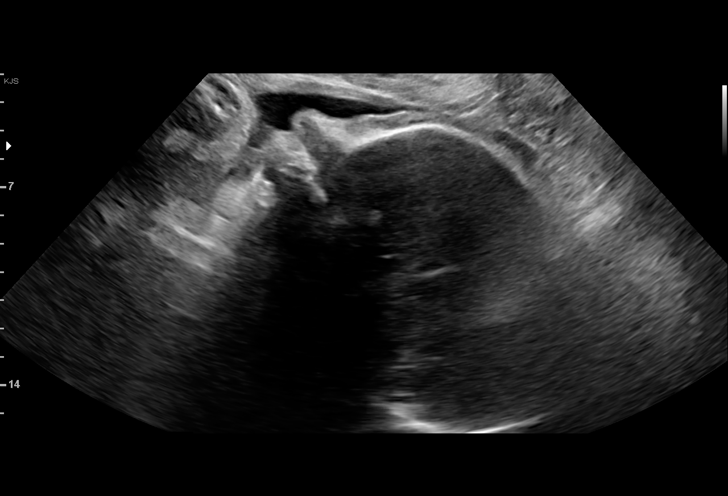

[14 of 28 positions shown; findings below may reference images not displayed]

Indications

 Poor obstetric history: Previous IUFD (22w)
 Echogenic intracardiac focus of the heart
 (EIF)
 36 weeks gestation of pregnancy
Fetal Evaluation

 Num Of Fetuses:         1
 Fetal Heart Rate(bpm):  153
 Cardiac Activity:       Observed
 Presentation:           Cephalic
 Placenta:               Anterior
 P. Cord Insertion:      Previously Visualized

 Amniotic Fluid
 AFI FV:      Within normal limits

 AFI Sum(cm)     %Tile       Largest Pocket(cm)
 16.6            62

 RUQ(cm)       RLQ(cm)       LUQ(cm)        LLQ(cm)

Biophysical Evaluation
 Amniotic F.V:   Pocket => 2 cm             F. Tone:        Observed
 F. Movement:    Observed                   Score:          [DATE]
 F. Breathing:   Observed
Biometry

 BPD:      89.7  mm     G. Age:  36w 2d         52  %    CI:        73.86   %    70 - 86
                                                         FL/HC:      22.1   %    20.8 -
 HC:      331.5  mm     G. Age:  37w 6d         49  %    HC/AC:      1.00        0.92 -
 AC:      331.2  mm     G. Age:  37w 0d         72  %    FL/BPD:     81.6   %    71 - 87
 FL:       73.2  mm     G. Age:  37w 3d         68  %    FL/AC:      22.1   %    20 - 24
 LV:        2.9  mm

 Est. FW:    3822  gm    6 lb 14 oz      66  %
OB History

 Gravidity:    4         Term:   2        Prem:   1
Gestational Age

 LMP:           34w 0d        Date:  03/17/21                 EDD:   12/22/21
 U/S Today:     37w 1d                                        EDD:   11/30/21
 Best:          36w 5d     Det. By:  Early Ultrasound         EDD:   12/03/21
                                     (05/31/21)
Anatomy

 Cranium:               Appears normal         LVOT:                   Previously seen
 Cavum:                 Appears normal         Aortic Arch:            Previously seen
 Ventricles:            Appears normal         Ductal Arch:            Previously seen
 Choroid Plexus:        Previously seen        Diaphragm:              Appears normal
 Cerebellum:            Previously seen        Stomach:                Appears normal, left
                                                                       sided
 Posterior Fossa:       Previously seen        Abdomen:                Previously seen
 Nuchal Fold:           Previously seen        Abdominal Wall:         Previously seen
 Face:                  Orbits and profile     Cord Vessels:           Previously seen
                        previously seen
 Lips:                  Previously seen        Kidneys:                Appear normal
 Palate:                Not well visualized    Bladder:                Appears normal
 Thoracic:              Previously seen        Spine:                  Limited views
                                                                       previously seen
 Heart:                 Appears normal         Upper Extremities:      Previously seen
                        (4CH, axis, and
                        situs)
 RVOT:                  Previously seen        Lower Extremities:      Previously seen

 Other:  Male gender previously seen. Nasal bone and lenses visualized
         previously.
Comments

 This patient was seen for a follow up growth scan due to a
 prior IUFD at around 22 weeks.  She denies any problems
 since her last exam.
 She was informed that the fetal growth and amniotic fluid
 level appears appropriate for her gestational age. The overall
 EFW of 6 pounds 14 ounces measures at the 66 percentile.
 A BPP performed today was [DATE].
 The patient already has NSTs/BPP scheduled for the next 2
 weeks in your office.
 She is already scheduled for a repeat C-section on [DATE],
 No further exams were scheduled in our office.
 All conversations were held with the patient today with the
 help of an Arabic interpreter.

## 2021-11-11 ENCOUNTER — Encounter: Payer: Self-pay | Admitting: Obstetrics and Gynecology

## 2021-11-11 ENCOUNTER — Ambulatory Visit (INDEPENDENT_AMBULATORY_CARE_PROVIDER_SITE_OTHER): Payer: Medicaid Other | Admitting: Obstetrics and Gynecology

## 2021-11-11 VITALS — BP 120/75 | HR 99 | Wt 188.3 lb

## 2021-11-11 DIAGNOSIS — O0990 Supervision of high risk pregnancy, unspecified, unspecified trimester: Secondary | ICD-10-CM

## 2021-11-11 DIAGNOSIS — O99820 Streptococcus B carrier state complicating pregnancy: Secondary | ICD-10-CM

## 2021-11-11 DIAGNOSIS — Z8759 Personal history of other complications of pregnancy, childbirth and the puerperium: Secondary | ICD-10-CM

## 2021-11-11 DIAGNOSIS — Z98891 History of uterine scar from previous surgery: Secondary | ICD-10-CM

## 2021-11-11 NOTE — Patient Instructions (Signed)

## 2021-11-11 NOTE — Progress Notes (Signed)
Subjective:  ?Paula Shea is a 36 y.o. [id number] at [redacted]w[redacted]d being seen today for ongoing prenatal care.  She is currently monitored for the following issues for this high-risk pregnancy and has History of cesarean delivery; History of IUFD; Supervision of high risk pregnancy, antepartum; and GBS (group B Streptococcus carrier), +RV culture, currently pregnant on their problem list. ? ?Patient reports general discomforts of pregnancy.  Contractions: Not present. Vag. Bleeding: None.  Movement: Present. Denies leaking of fluid.  ? ?The following portions of the patient's history were reviewed and updated as appropriate: allergies, current medications, past family history, past medical history, past social history, past surgical history and problem list. Problem list updated. ? ?Objective:  ? ?Vitals:  ? 11/11/21 1109  ?BP: 120/75  ?Pulse: 99  ?Weight: 188 lb 4.8 oz (85.4 kg)  ? ? ?Fetal Status: Fetal Heart Rate (bpm): 137   Movement: Present    ? ?General:  Alert, oriented and cooperative. Patient is in no acute distress.  ?Skin: Skin is warm and dry. No rash noted.   ?Cardiovascular: Normal heart rate noted  ?Respiratory: Normal respiratory effort, no problems with respiration noted  ?Abdomen: Soft, gravid, appropriate for gestational age. Pain/Pressure: Absent     ?Pelvic:  Cervical exam deferred        ?Extremities: Normal range of motion.  Edema: Trace  ?Mental Status: Normal mood and affect. Normal behavior. Normal judgment and thought content.  ? ?Urinalysis:     ? ?Assessment and Plan:  ?Pregnancy: [id number] at [redacted]w[redacted]d ? ?1. Supervision of high risk pregnancy, antepartum ?Stable ?Labor precautions ? ?2. GBS (group B Streptococcus carrier), +RV culture, currently pregnant ?Tx while in labor ? ?3. History of IUFD ?Serial growth scans and antenatal testing as per MFM ? ?4. H/O c section x 2 ?For repeat at 39 weeks, scheudled ? ?Term labor symptoms and general obstetric precautions including but not limited to vaginal  bleeding, contractions, leaking of fluid and fetal movement were reviewed in detail with the patient. ?Please refer to After Visit Summary for other counseling recommendations.  ?Return in about 1 week (around 11/18/2021) for OB visit, face to face, any provider. ? ? ?Hermina Staggers, MD ? ?

## 2021-11-15 ENCOUNTER — Telehealth (HOSPITAL_COMMUNITY): Payer: Self-pay | Admitting: *Deleted

## 2021-11-15 ENCOUNTER — Other Ambulatory Visit: Payer: Medicaid Other

## 2021-11-15 NOTE — Telephone Encounter (Signed)
Preadmission screen  

## 2021-11-17 ENCOUNTER — Encounter (HOSPITAL_COMMUNITY): Payer: Self-pay

## 2021-11-17 NOTE — Pre-Procedure Instructions (Addendum)
Interpreter number [id number] Preadmission screen

## 2021-11-18 ENCOUNTER — Ambulatory Visit (INDEPENDENT_AMBULATORY_CARE_PROVIDER_SITE_OTHER): Payer: Medicaid Other

## 2021-11-18 ENCOUNTER — Ambulatory Visit: Payer: Medicaid Other | Admitting: *Deleted

## 2021-11-18 VITALS — BP 93/60 | HR 94 | Wt 188.3 lb

## 2021-11-18 DIAGNOSIS — Z8759 Personal history of other complications of pregnancy, childbirth and the puerperium: Secondary | ICD-10-CM | POA: Diagnosis not present

## 2021-11-18 IMAGING — US US FETAL BPP W/ NON-STRESS
1 series · 13 of 14 positions shown · non-contrast
Comparison: none

[Series 1: us fetal bpp w/ non-stress · 14 acquisitions, 13 frames shown]
[im 1/14]
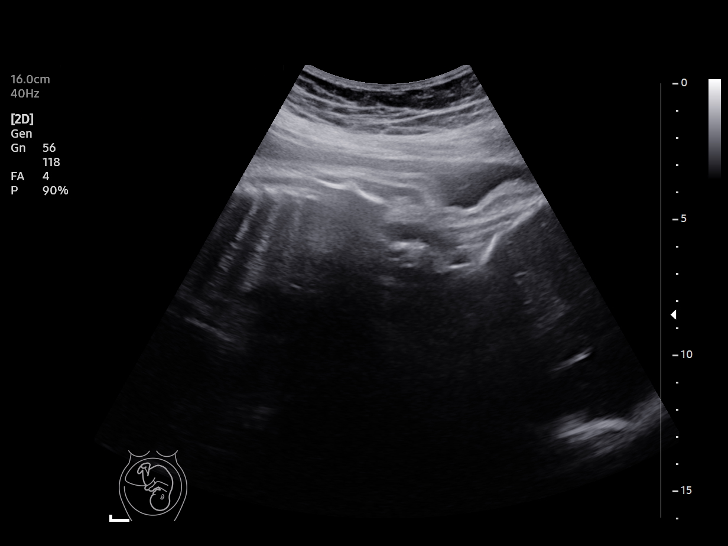
[im 2/14]
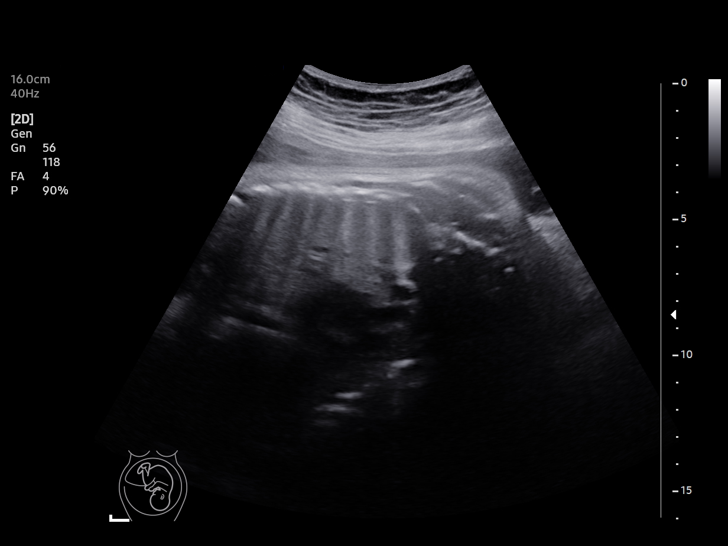
[im 3/14]
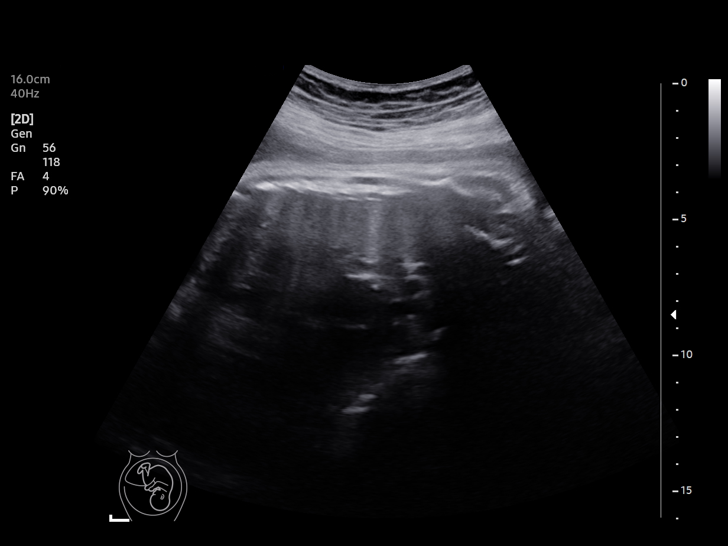
[im 4/14]
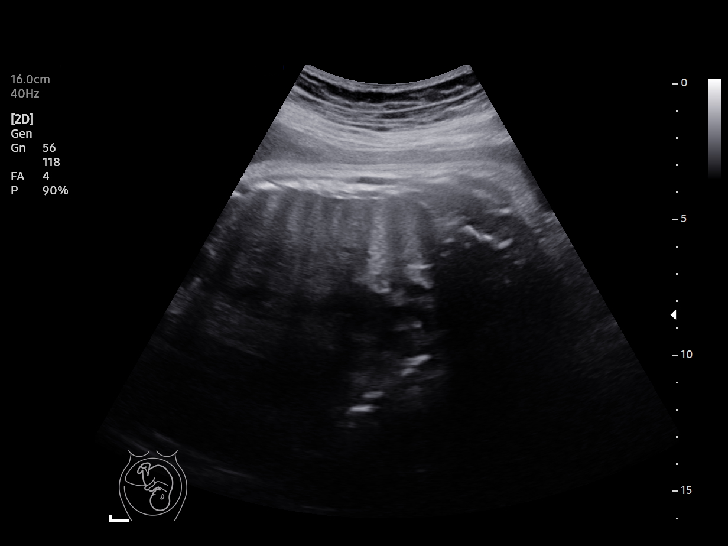
[im 5/14]
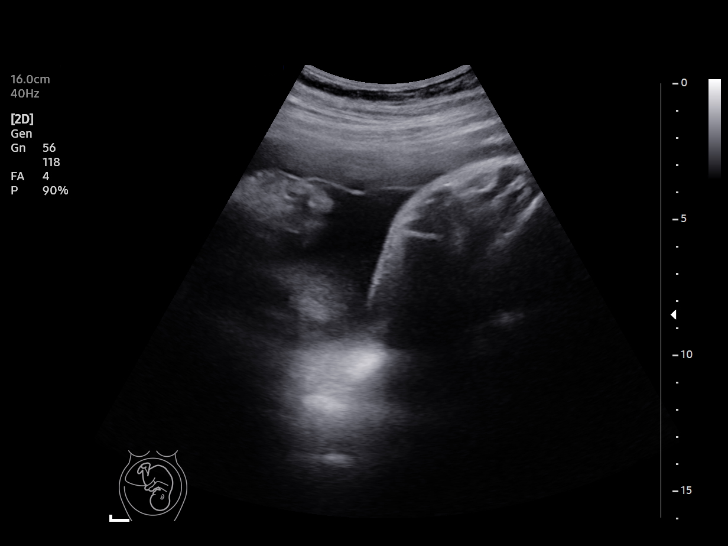
[im 6/14]
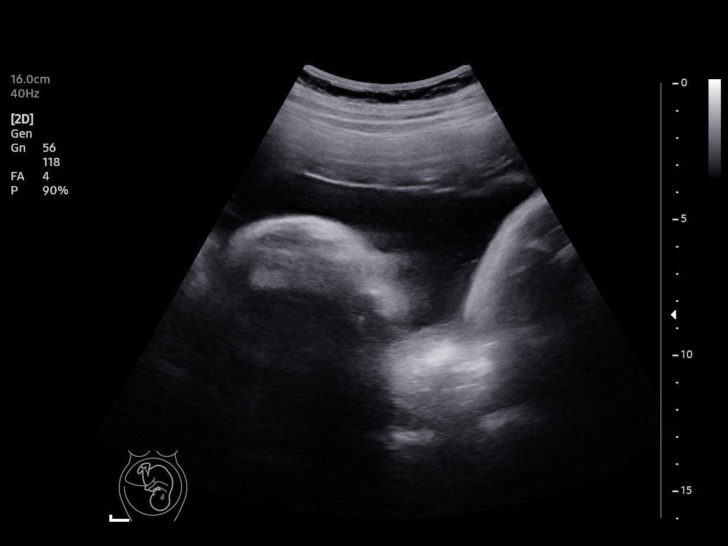
[im 8/14]
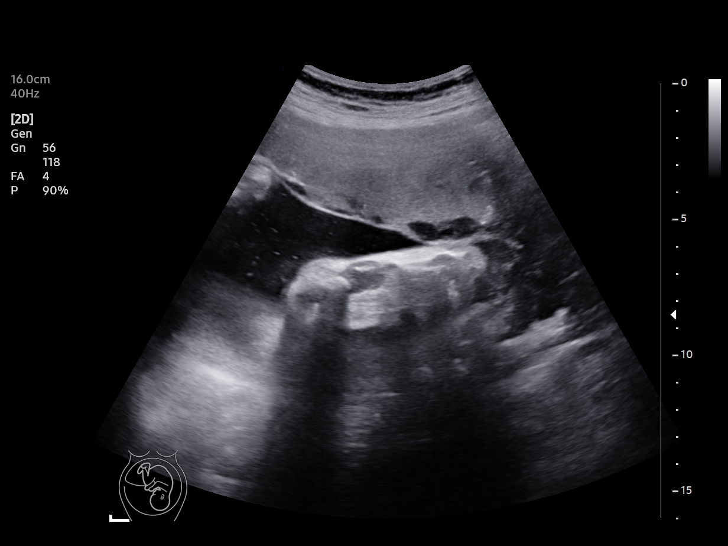
[im 9/14]
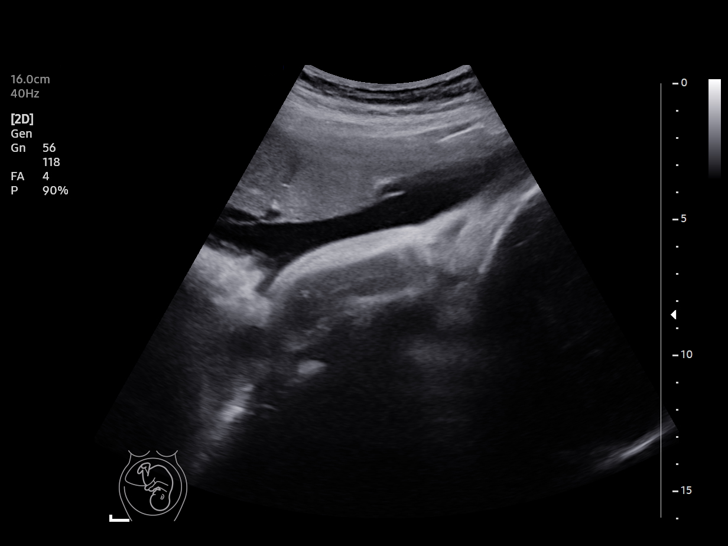
[im 10/14]
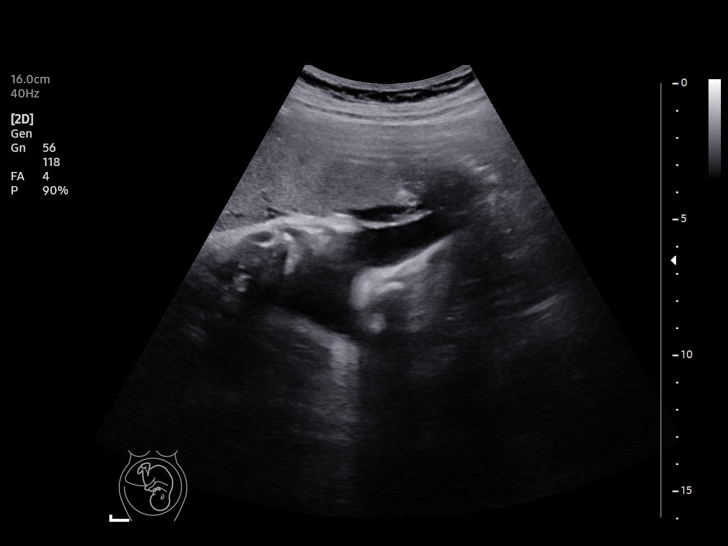
[im 11/14]
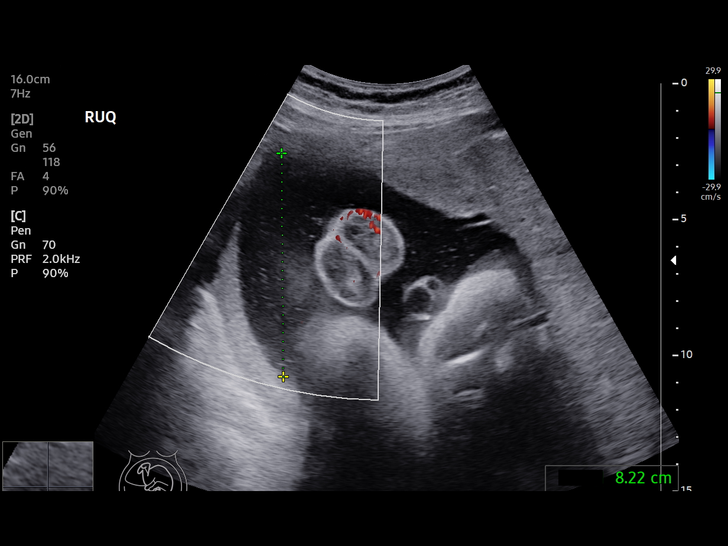
[im 12/14]
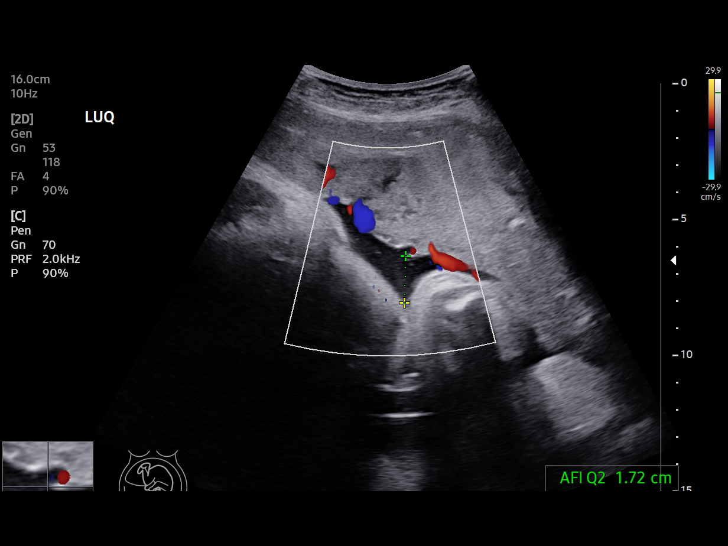
[im 13/14]
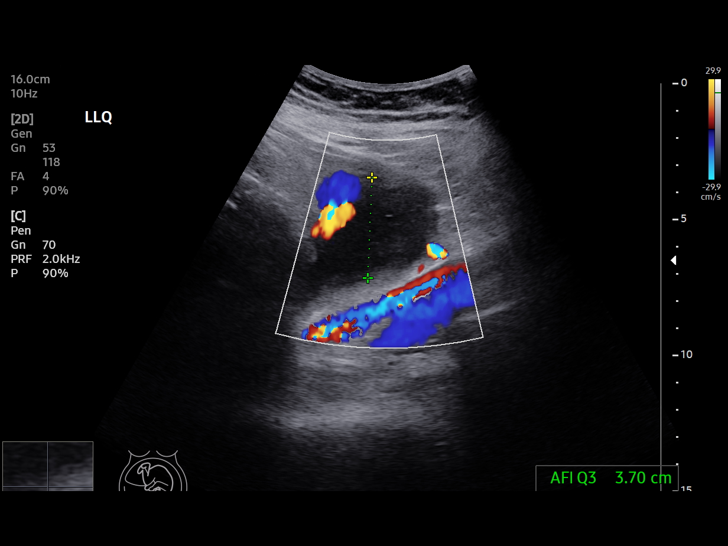
[im 14/14]
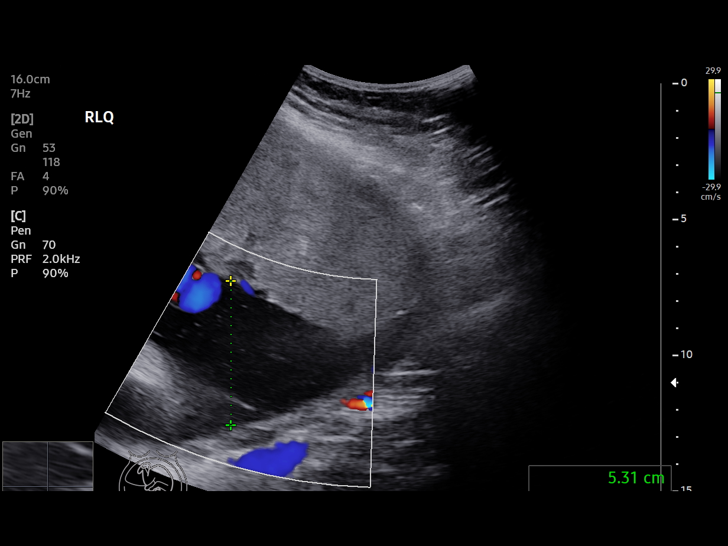

[13 of 14 positions shown; findings below may reference images not displayed]

[REDACTED]care at

 1  US FETAL BPP W/NONSTRESS              76818.4     RANAE LIO

Service(s) Provided

Indications

 37 weeks gestation of pregnancy
 Poor obstetric history: Previous IUFD
 (stillbirth)
Fetal Evaluation

 Num Of Fetuses:         1
 Preg. Location:         Intrauterine
 Cardiac Activity:       Observed
 Presentation:           Cephalic

 Amniotic Fluid
 AFI FV:      Within normal limits

 AFI Sum(cm)     %Tile       Largest Pocket(cm)
 18.95           74

 RUQ(cm)       RLQ(cm)       LUQ(cm)        LLQ(cm)

Biophysical Evaluation
 Amniotic F.V:   Pocket => 2 cm             F. Tone:        Observed
 F. Movement:    Observed                   N.S.T:          Reactive
 F. Breathing:   Observed                   Score:          [DATE]
OB History

 Gravidity:    4         Term:   2        Prem:   1
Gestational Age

 LMP:           35w 1d        Date:  03/17/21                  EDD:   12/22/21
 Best:          37w 6d     Det. By:  Early Ultrasound         EDD:   12/03/21
                                     (05/31/21)
Impression

 Reassuring antenatal testing
Recommendations

 Continue with weekly BPPs until delivery
                Lop, Siril

## 2021-11-18 NOTE — Progress Notes (Signed)

## 2021-11-24 ENCOUNTER — Other Ambulatory Visit (HOSPITAL_COMMUNITY)
Admission: RE | Admit: 2021-11-24 | Discharge: 2021-11-24 | Disposition: A | Discharge status: Home / Self Care | Payer: Medicaid Other | Source: Ambulatory Visit | Attending: Obstetrics and Gynecology | Admitting: Obstetrics and Gynecology

## 2021-11-24 DIAGNOSIS — Z98891 History of uterine scar from previous surgery: Secondary | ICD-10-CM | POA: Diagnosis not present

## 2021-11-24 DIAGNOSIS — Z01812 Encounter for preprocedural laboratory examination: Secondary | ICD-10-CM | POA: Diagnosis present

## 2021-11-24 LAB — CBC
HCT: 35.2 % — ABNORMAL LOW (ref 36.0–46.0)
Hemoglobin: 11.6 g/dL — ABNORMAL LOW (ref 12.0–15.0)
MCH: 30.8 pg (ref 26.0–34.0)
MCHC: 33 g/dL (ref 30.0–36.0)
MCV: 93.4 fL (ref 80.0–100.0)
Platelets: 210 10*3/uL (ref 150–400)
RBC: 3.77 MIL/uL — ABNORMAL LOW (ref 3.87–5.11)
RDW: 13.2 % (ref 11.5–15.5)
WBC: 7 10*3/uL (ref 4.0–10.5)
nRBC: 0 % (ref 0.0–0.2)

## 2021-11-24 LAB — RPR: RPR Ser Ql: NONREACTIVE

## 2021-11-25 ENCOUNTER — Ambulatory Visit (INDEPENDENT_AMBULATORY_CARE_PROVIDER_SITE_OTHER): Payer: Medicaid Other | Admitting: Family Medicine

## 2021-11-25 ENCOUNTER — Ambulatory Visit (INDEPENDENT_AMBULATORY_CARE_PROVIDER_SITE_OTHER): Payer: Medicaid Other

## 2021-11-25 VITALS — BP 107/56 | HR 77 | Wt 191.3 lb

## 2021-11-25 DIAGNOSIS — Z8759 Personal history of other complications of pregnancy, childbirth and the puerperium: Secondary | ICD-10-CM | POA: Diagnosis not present

## 2021-11-25 DIAGNOSIS — O99820 Streptococcus B carrier state complicating pregnancy: Secondary | ICD-10-CM

## 2021-11-25 DIAGNOSIS — O0990 Supervision of high risk pregnancy, unspecified, unspecified trimester: Secondary | ICD-10-CM

## 2021-11-25 DIAGNOSIS — Z98891 History of uterine scar from previous surgery: Secondary | ICD-10-CM

## 2021-11-25 LAB — TYPE AND SCREEN
ABO/RH(D): O POS
Antibody Screen: NEGATIVE

## 2021-11-25 IMAGING — US US FETAL BPP W/ NON-STRESS
1 series · 13 of 14 positions shown · non-contrast
Comparison: none

[Series 1: us fetal bpp w/ non-stress · 14 acquisitions, 13 frames shown]
[im 1/14]
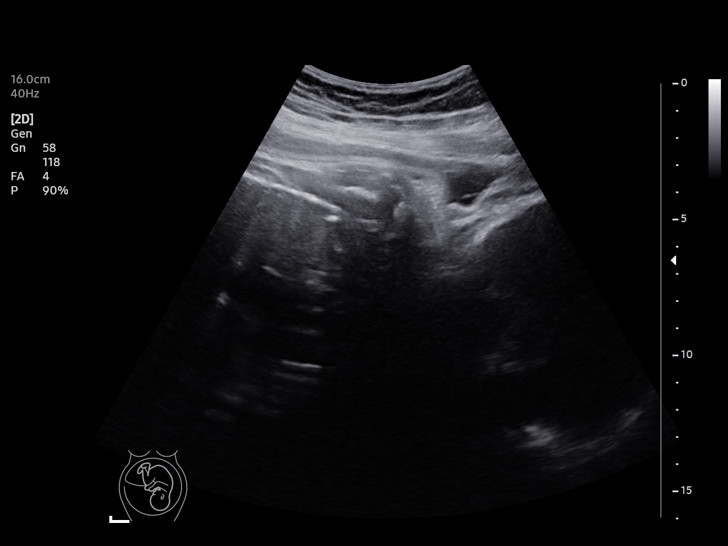
[im 2/14]
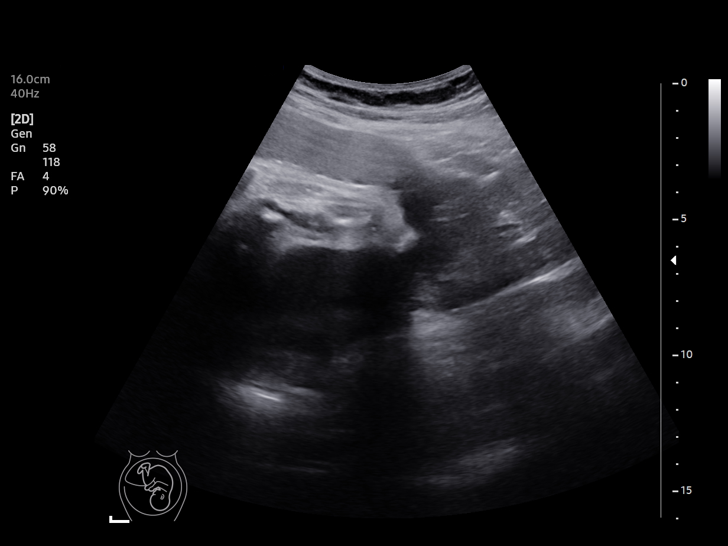
[im 3/14]
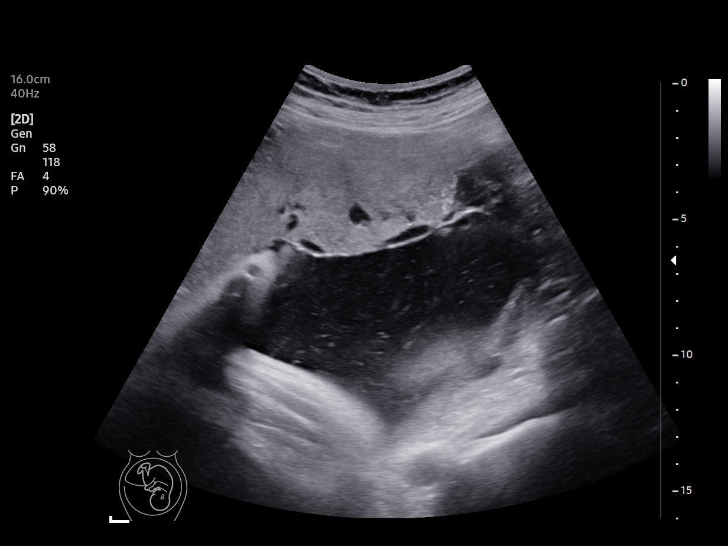
[im 4/14]
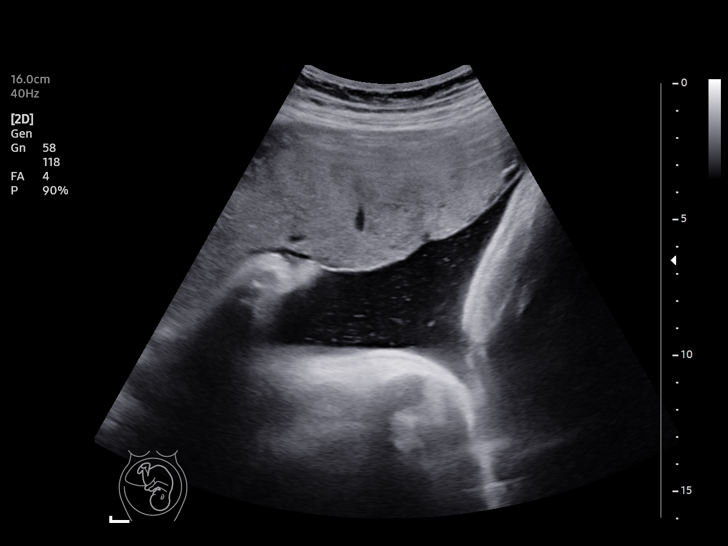
[im 5/14]
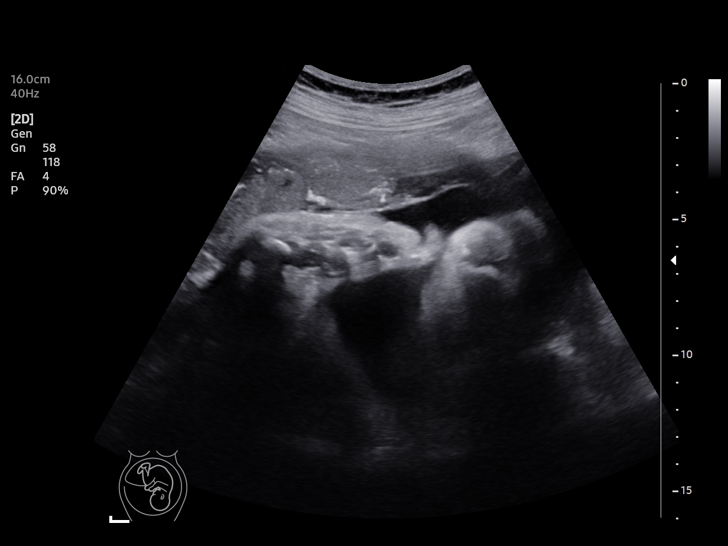
[im 6/14]
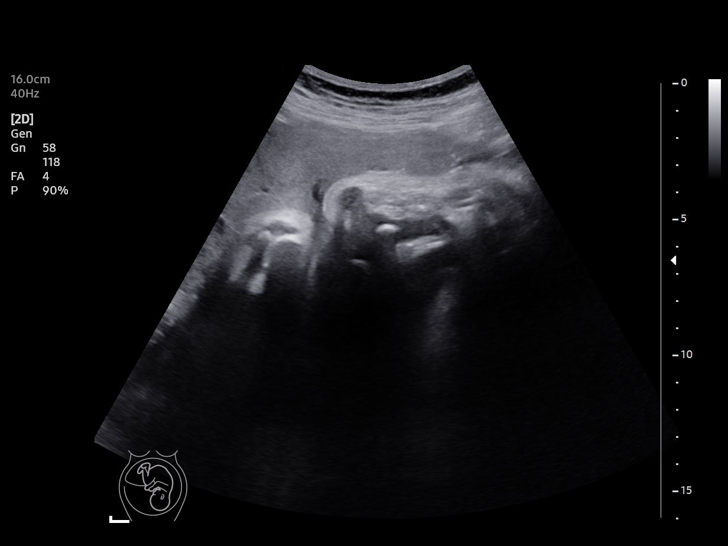
[im 8/14]
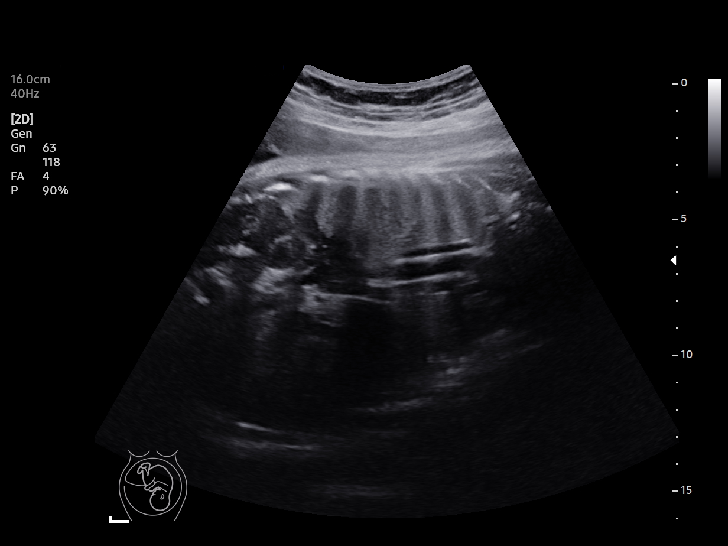
[im 9/14]
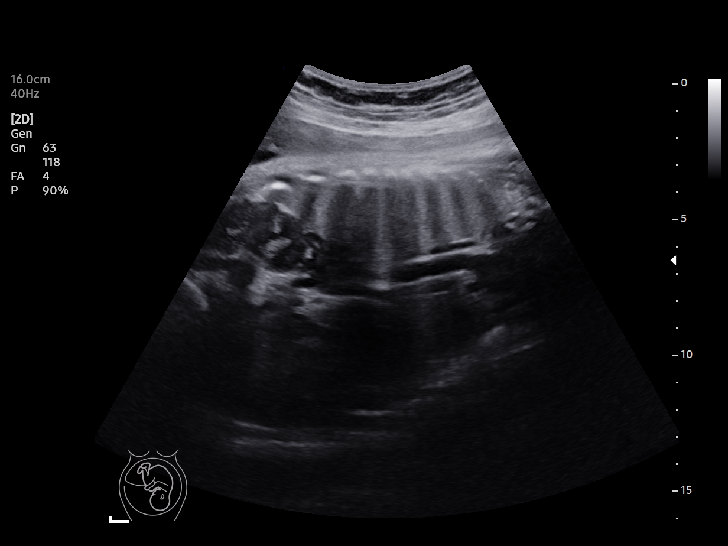
[im 10/14]
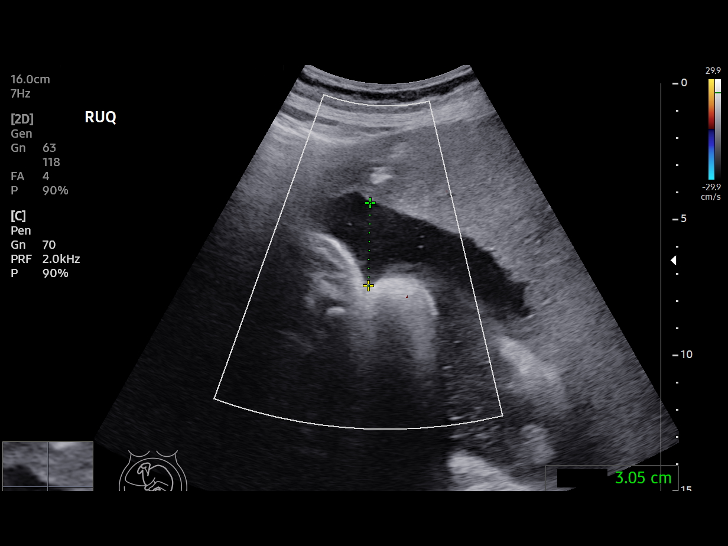
[im 11/14]
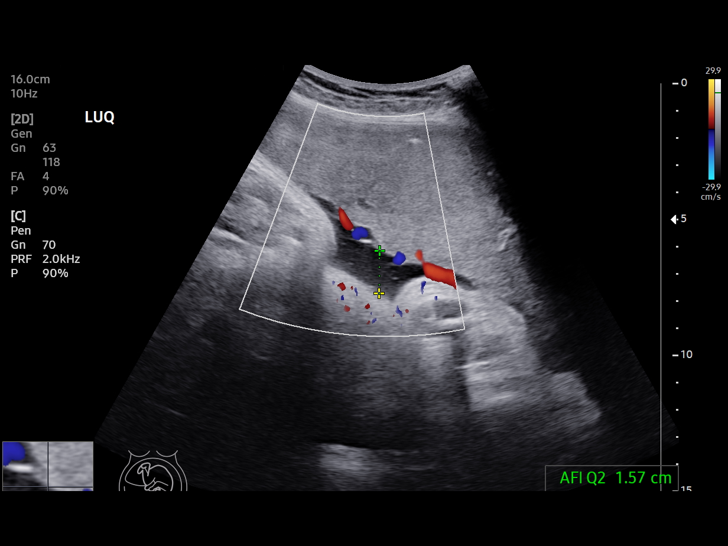
[im 12/14]
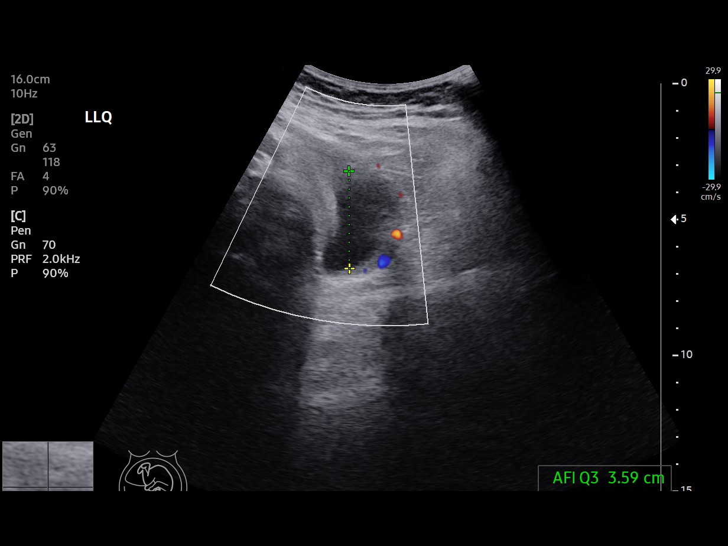
[im 13/14]
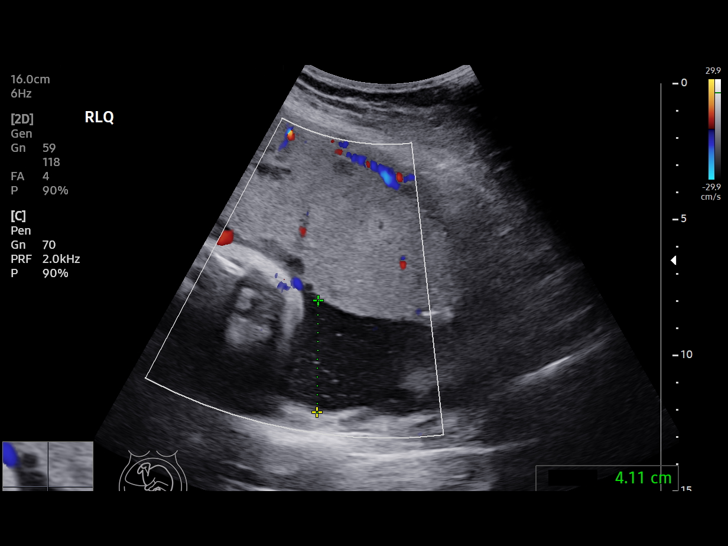
[im 14/14]
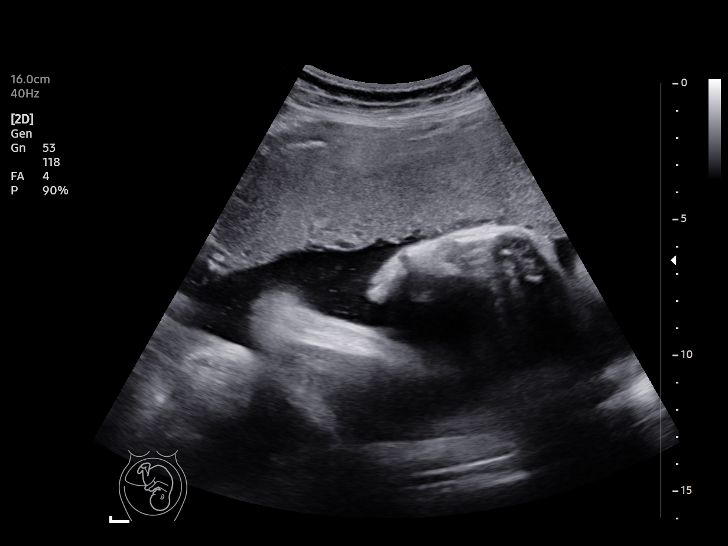

[13 of 14 positions shown; findings below may reference images not displayed]

[REDACTED]care at

 1  US FETAL BPP W/NONSTRESS              76818.4     BLAIR BEHNKE

Service(s) Provided

Indications

 38 weeks gestation of pregnancy
 Poor obstetric history: Previous IUFD
 (stillbirth)
Fetal Evaluation

 Num Of Fetuses:         1
 Preg. Location:         Intrauterine
 Cardiac Activity:       Observed
 Presentation:           Cephalic

 Amniotic Fluid
 AFI FV:      Within normal limits

 AFI Sum(cm)     %Tile       Largest Pocket(cm)
 12.32           46

 RUQ(cm)       RLQ(cm)       LUQ(cm)        LLQ(cm)

Biophysical Evaluation
 Amniotic F.V:   Pocket => 2 cm             F. Tone:        Observed
 F. Movement:    Observed                   N.S.T:          Reactive
 F. Breathing:   Observed                   Score:          [DATE]
OB History

 Gravidity:    4         Term:   2        Prem:   1
Gestational Age

 LMP:           36w 1d        Date:  03/17/21                  EDD:   12/22/21
 Best:          38w 6d     Det. By:  Early Ultrasound         EDD:   12/03/21
                                     (05/31/21)
Impression

 IUP @ 38 weeks
 Prior IUFD
 BPP [DATE]
Recommendations

 Continue weekly antenatal testing till delivery .
                 Villalva, Haroon

## 2021-11-25 NOTE — Progress Notes (Signed)

## 2021-11-25 NOTE — Progress Notes (Signed)
   PRENATAL VISIT NOTE  Subjective:  Paula Shea is a 36 y.o. [id number] at [redacted]w[redacted]d being seen today for ongoing prenatal care.  She is currently monitored for the following issues for this high-risk pregnancy and has History of cesarean delivery; History of IUFD; Supervision of high risk pregnancy, antepartum; and GBS (group B Streptococcus carrier), +RV culture, currently pregnant on their problem list.  Patient reports no complaints.  Contractions: Irregular. Vag. Bleeding: None.  Movement: Present. Denies leaking of fluid.   The following portions of the patient's history were reviewed and updated as appropriate: allergies, current medications, past family history, past medical history, past social history, past surgical history and problem list.   Objective:   Vitals:   11/25/21 1030  BP: (!) 107/56  Pulse: 77  Weight: 191 lb 4.8 oz (86.8 kg)    Fetal Status: Fetal Heart Rate (bpm): RNST   Movement: Present  Presentation: Vertex  General:  Alert, oriented and cooperative. Patient is in no acute distress.  Skin: Skin is warm and dry. No rash noted.   Cardiovascular: Normal heart rate noted  Respiratory: Normal respiratory effort, no problems with respiration noted  Abdomen: Soft, gravid, appropriate for gestational age.  Pain/Pressure: Absent     Pelvic: Cervical exam deferred        Extremities: Normal range of motion.     Mental Status: Normal mood and affect. Normal behavior. Normal judgment and thought content.   Assessment and Plan:  Pregnancy: [id number] at [redacted]w[redacted]d 1. History of IUFD In antenatal testing--last growth at 66% - US FETAL BPP W/NONSTRESS; Future NST:  Baseline: 130 bpm, Variability: Good {> 6 bpm), Accelerations: Reactive, and Decelerations: Absent  2. GBS (group B Streptococcus carrier), +RV culture, currently pregnant  3. Supervision of high risk pregnancy, antepartum Continue routine prenatal care.  4. History of cesarean delivery For RCS on  tomorrow.  Term labor symptoms and general obstetric precautions including but not limited to vaginal bleeding, contractions, leaking of fluid and fetal movement were reviewed in detail with the patient. Please refer to After Visit Summary for other counseling recommendations.   No follow-ups on file.  No future appointments.  Reva Bores, MD

## 2021-11-26 ENCOUNTER — Inpatient Hospital Stay (HOSPITAL_COMMUNITY): Payer: Medicaid Other | Admitting: Anesthesiology

## 2021-11-26 ENCOUNTER — Encounter (HOSPITAL_COMMUNITY): Payer: Self-pay | Admitting: Family Medicine

## 2021-11-26 ENCOUNTER — Encounter (HOSPITAL_COMMUNITY)
Admission: RE | Disposition: A | Discharge status: Home / Self Care | Payer: Self-pay | Source: Home / Self Care | Attending: Family Medicine

## 2021-11-26 ENCOUNTER — Other Ambulatory Visit: Payer: Self-pay

## 2021-11-26 ENCOUNTER — Inpatient Hospital Stay (HOSPITAL_COMMUNITY)
Admission: RE | Admit: 2021-11-26 | Discharge: 2021-11-28 | DRG: 787 | Disposition: A | Discharge status: Home / Self Care | Payer: Medicaid Other | Attending: Family Medicine | Admitting: Family Medicine

## 2021-11-26 DIAGNOSIS — O99820 Streptococcus B carrier state complicating pregnancy: Secondary | ICD-10-CM

## 2021-11-26 DIAGNOSIS — O0990 Supervision of high risk pregnancy, unspecified, unspecified trimester: Secondary | ICD-10-CM

## 2021-11-26 DIAGNOSIS — O34219 Maternal care for unspecified type scar from previous cesarean delivery: Secondary | ICD-10-CM | POA: Diagnosis present

## 2021-11-26 DIAGNOSIS — Z3A39 39 weeks gestation of pregnancy: Secondary | ICD-10-CM

## 2021-11-26 DIAGNOSIS — O99824 Streptococcus B carrier state complicating childbirth: Secondary | ICD-10-CM | POA: Diagnosis present

## 2021-11-26 DIAGNOSIS — O9081 Anemia of the puerperium: Secondary | ICD-10-CM | POA: Diagnosis not present

## 2021-11-26 DIAGNOSIS — O09292 Supervision of pregnancy with other poor reproductive or obstetric history, second trimester: Secondary | ICD-10-CM

## 2021-11-26 DIAGNOSIS — D62 Acute posthemorrhagic anemia: Secondary | ICD-10-CM | POA: Diagnosis not present

## 2021-11-26 DIAGNOSIS — O34211 Maternal care for low transverse scar from previous cesarean delivery: Principal | ICD-10-CM | POA: Diagnosis present

## 2021-11-26 DIAGNOSIS — Z8759 Personal history of other complications of pregnancy, childbirth and the puerperium: Secondary | ICD-10-CM

## 2021-11-26 DIAGNOSIS — Z98891 History of uterine scar from previous surgery: Secondary | ICD-10-CM

## 2021-11-26 LAB — TYPE AND SCREEN
ABO/RH(D): O POS
Antibody Screen: NEGATIVE

## 2021-11-26 SURGERY — Surgical Case
Anesthesia: Spinal | Wound class: Clean Contaminated

## 2021-11-26 MED ORDER — NALOXONE HCL 4 MG/10ML IJ SOLN: 1.0000 ug/kg/h | INTRAVENOUS | Status: DC | PRN

## 2021-11-26 MED ORDER — SIMETHICONE 80 MG PO CHEW
80.0000 mg | CHEWABLE_TABLET | Freq: Three times a day (TID) | ORAL | Status: DC
  Administered 2021-11-26 – 2021-11-28 (×5): 80 mg via ORAL
  Filled 2021-11-26 (×5): qty 1

## 2021-11-26 MED ORDER — MORPHINE SULFATE (PF) 0.5 MG/ML IJ SOLN
INTRAMUSCULAR | Status: DC | PRN
  Administered 2021-11-26: 150 ug via INTRATHECAL

## 2021-11-26 MED ORDER — SOD CITRATE-CITRIC ACID 500-334 MG/5ML PO SOLN
ORAL | Status: AC
  Filled 2021-11-26: qty 30

## 2021-11-26 MED ORDER — PHENYLEPHRINE HCL-NACL 20-0.9 MG/250ML-% IV SOLN
INTRAVENOUS | Status: AC
  Filled 2021-11-26: qty 250

## 2021-11-26 MED ORDER — BUPIVACAINE IN DEXTROSE 0.75-8.25 % IT SOLN
INTRATHECAL | Status: DC | PRN
  Administered 2021-11-26: 1.6 mL via INTRATHECAL

## 2021-11-26 MED ORDER — DIPHENHYDRAMINE HCL 25 MG PO CAPS
25.0000 mg | ORAL_CAPSULE | Freq: Four times a day (QID) | ORAL | Status: DC | PRN
  Administered 2021-11-27: 25 mg via ORAL
  Filled 2021-11-26: qty 1

## 2021-11-26 MED ORDER — DIPHENHYDRAMINE HCL 50 MG/ML IJ SOLN: 12.5000 mg | INTRAMUSCULAR | Status: DC | PRN

## 2021-11-26 MED ORDER — SIMETHICONE 80 MG PO CHEW: 80.0000 mg | CHEWABLE_TABLET | ORAL | Status: DC | PRN

## 2021-11-26 MED ORDER — OXYTOCIN-SODIUM CHLORIDE 30-0.9 UT/500ML-% IV SOLN: 2.5000 [IU]/h | INTRAVENOUS | Status: AC

## 2021-11-26 MED ORDER — NALOXONE HCL 0.4 MG/ML IJ SOLN: 0.4000 mg | INTRAMUSCULAR | Status: DC | PRN

## 2021-11-26 MED ORDER — ONDANSETRON HCL 4 MG/2ML IJ SOLN: 4.0000 mg | Freq: Three times a day (TID) | INTRAMUSCULAR | Status: DC | PRN

## 2021-11-26 MED ORDER — ACETAMINOPHEN 500 MG PO TABS
1000.0000 mg | ORAL_TABLET | ORAL | Status: AC
  Administered 2021-11-26: 1000 mg via ORAL

## 2021-11-26 MED ORDER — ENOXAPARIN SODIUM 40 MG/0.4ML IJ SOSY
40.0000 mg | PREFILLED_SYRINGE | INTRAMUSCULAR | Status: DC
  Administered 2021-11-27 – 2021-11-28 (×2): 40 mg via SUBCUTANEOUS
  Filled 2021-11-26 (×2): qty 0.4

## 2021-11-26 MED ORDER — KETOROLAC TROMETHAMINE 30 MG/ML IJ SOLN
30.0000 mg | Freq: Four times a day (QID) | INTRAMUSCULAR | Status: AC
  Administered 2021-11-26 – 2021-11-27 (×3): 30 mg via INTRAVENOUS
  Filled 2021-11-26 (×3): qty 1

## 2021-11-26 MED ORDER — KETOROLAC TROMETHAMINE 30 MG/ML IJ SOLN: 30.0000 mg | Freq: Four times a day (QID) | INTRAMUSCULAR | Status: AC | PRN

## 2021-11-26 MED ORDER — OXYTOCIN-SODIUM CHLORIDE 30-0.9 UT/500ML-% IV SOLN
INTRAVENOUS | Status: AC
  Filled 2021-11-26: qty 500

## 2021-11-26 MED ORDER — DEXAMETHASONE SODIUM PHOSPHATE 4 MG/ML IJ SOLN
INTRAMUSCULAR | Status: DC | PRN
Start: 2021-11-26 — End: 2021-11-26
  Administered 2021-11-26: 8 mg via INTRAVENOUS

## 2021-11-26 MED ORDER — FENTANYL CITRATE (PF) 100 MCG/2ML IJ SOLN
INTRAMUSCULAR | Status: AC
  Filled 2021-11-26: qty 2

## 2021-11-26 MED ORDER — KETOROLAC TROMETHAMINE 30 MG/ML IJ SOLN
INTRAMUSCULAR | Status: AC
  Filled 2021-11-26: qty 1

## 2021-11-26 MED ORDER — METOCLOPRAMIDE HCL 5 MG/ML IJ SOLN
INTRAMUSCULAR | Status: AC
  Filled 2021-11-26: qty 2

## 2021-11-26 MED ORDER — ACETAMINOPHEN 500 MG PO TABS
1000.0000 mg | ORAL_TABLET | Freq: Four times a day (QID) | ORAL | Status: DC
  Administered 2021-11-26 – 2021-11-28 (×7): 1000 mg via ORAL
  Filled 2021-11-26 (×7): qty 2

## 2021-11-26 MED ORDER — TRIAMCINOLONE ACETONIDE 40 MG/ML IJ SUSP
INTRAMUSCULAR | Status: AC
  Filled 2021-11-26: qty 1

## 2021-11-26 MED ORDER — LACTATED RINGERS IV SOLN: INTRAVENOUS | Status: DC

## 2021-11-26 MED ORDER — OXYCODONE HCL 5 MG/5ML PO SOLN: 5.0000 mg | Freq: Once | ORAL | Status: DC | PRN

## 2021-11-26 MED ORDER — ONDANSETRON HCL 4 MG/2ML IJ SOLN
INTRAMUSCULAR | Status: DC | PRN
Start: 2021-11-26 — End: 2021-11-26
  Administered 2021-11-26: 4 mg via INTRAVENOUS

## 2021-11-26 MED ORDER — MEPERIDINE HCL 25 MG/ML IJ SOLN: 6.2500 mg | INTRAMUSCULAR | Status: DC | PRN

## 2021-11-26 MED ORDER — SCOPOLAMINE 1 MG/3DAYS TD PT72
MEDICATED_PATCH | TRANSDERMAL | Status: DC | PRN
  Administered 2021-11-26: 1 via TRANSDERMAL

## 2021-11-26 MED ORDER — FENTANYL CITRATE (PF) 100 MCG/2ML IJ SOLN
INTRAMUSCULAR | Status: DC | PRN
Start: 2021-11-26 — End: 2021-11-26
  Administered 2021-11-26: 15 ug via INTRATHECAL

## 2021-11-26 MED ORDER — OXYTOCIN-SODIUM CHLORIDE 30-0.9 UT/500ML-% IV SOLN
INTRAVENOUS | Status: DC | PRN
  Administered 2021-11-26: 300 mL via INTRAVENOUS

## 2021-11-26 MED ORDER — DIBUCAINE (PERIANAL) 1 % EX OINT: 1.0000 "application " | TOPICAL_OINTMENT | CUTANEOUS | Status: DC | PRN

## 2021-11-26 MED ORDER — MENTHOL 3 MG MT LOZG: 1.0000 | LOZENGE | OROMUCOSAL | Status: DC | PRN

## 2021-11-26 MED ORDER — ACETAMINOPHEN 500 MG PO TABS
ORAL_TABLET | ORAL | Status: AC
  Filled 2021-11-26: qty 2

## 2021-11-26 MED ORDER — CEFAZOLIN SODIUM-DEXTROSE 2-4 GM/100ML-% IV SOLN
INTRAVENOUS | Status: AC
  Filled 2021-11-26: qty 100

## 2021-11-26 MED ORDER — KETOROLAC TROMETHAMINE 30 MG/ML IJ SOLN
30.0000 mg | Freq: Four times a day (QID) | INTRAMUSCULAR | Status: AC | PRN
  Administered 2021-11-26: 30 mg via INTRAVENOUS

## 2021-11-26 MED ORDER — STERILE WATER FOR IRRIGATION IR SOLN
Status: DC | PRN
  Administered 2021-11-26: 1000 mL

## 2021-11-26 MED ORDER — TRIAMCINOLONE ACETONIDE 40 MG/ML IJ SUSP
INTRAMUSCULAR | Status: DC | PRN
  Administered 2021-11-26: 40 mg via INTRAMUSCULAR

## 2021-11-26 MED ORDER — SENNOSIDES-DOCUSATE SODIUM 8.6-50 MG PO TABS
2.0000 | ORAL_TABLET | Freq: Every day | ORAL | Status: DC
  Administered 2021-11-27 – 2021-11-28 (×2): 2 via ORAL
  Filled 2021-11-26 (×2): qty 2

## 2021-11-26 MED ORDER — PHENYLEPHRINE 80 MCG/ML (10ML) SYRINGE FOR IV PUSH (FOR BLOOD PRESSURE SUPPORT)
PREFILLED_SYRINGE | INTRAVENOUS | Status: AC
  Filled 2021-11-26: qty 10

## 2021-11-26 MED ORDER — ACETAMINOPHEN 10 MG/ML IV SOLN: 1000.0000 mg | Freq: Once | INTRAVENOUS | Status: DC | PRN

## 2021-11-26 MED ORDER — COCONUT OIL OIL
1.0000 | TOPICAL_OIL | Status: DC | PRN
Start: 2021-11-26 — End: 2021-11-28

## 2021-11-26 MED ORDER — WITCH HAZEL-GLYCERIN EX PADS: 1.0000 "application " | MEDICATED_PAD | CUTANEOUS | Status: DC | PRN

## 2021-11-26 MED ORDER — OXYCODONE HCL 5 MG PO TABS: 5.0000 mg | ORAL_TABLET | Freq: Once | ORAL | Status: DC | PRN

## 2021-11-26 MED ORDER — SODIUM CHLORIDE 0.9% FLUSH: 3.0000 mL | INTRAVENOUS | Status: DC | PRN

## 2021-11-26 MED ORDER — SOD CITRATE-CITRIC ACID 500-334 MG/5ML PO SOLN
30.0000 mL | ORAL | Status: AC
  Administered 2021-11-26: 30 mL via ORAL

## 2021-11-26 MED ORDER — ONDANSETRON HCL 4 MG/2ML IJ SOLN
INTRAMUSCULAR | Status: AC
  Filled 2021-11-26: qty 2

## 2021-11-26 MED ORDER — METOCLOPRAMIDE HCL 5 MG/ML IJ SOLN
INTRAMUSCULAR | Status: DC | PRN
Start: 2021-11-26 — End: 2021-11-26
  Administered 2021-11-26: 5 mg via INTRAVENOUS

## 2021-11-26 MED ORDER — HYDROMORPHONE HCL 1 MG/ML IJ SOLN: 0.2500 mg | INTRAMUSCULAR | Status: DC | PRN

## 2021-11-26 MED ORDER — SODIUM CHLORIDE 0.9 % IR SOLN
Status: DC | PRN
  Administered 2021-11-26: 1000 mL

## 2021-11-26 MED ORDER — PROMETHAZINE HCL 25 MG/ML IJ SOLN: 6.2500 mg | INTRAMUSCULAR | Status: DC | PRN

## 2021-11-26 MED ORDER — SCOPOLAMINE 1 MG/3DAYS TD PT72
1.0000 | MEDICATED_PATCH | Freq: Once | TRANSDERMAL | Status: DC
Start: 2021-11-26 — End: 2021-11-28

## 2021-11-26 MED ORDER — IBUPROFEN 600 MG PO TABS
600.0000 mg | ORAL_TABLET | Freq: Four times a day (QID) | ORAL | Status: DC
  Administered 2021-11-27 – 2021-11-28 (×5): 600 mg via ORAL
  Filled 2021-11-26 (×5): qty 1

## 2021-11-26 MED ORDER — PRENATAL MULTIVITAMIN CH
1.0000 | ORAL_TABLET | Freq: Every day | ORAL | Status: DC
  Administered 2021-11-27 – 2021-11-28 (×2): 1 via ORAL
  Filled 2021-11-26 (×2): qty 1

## 2021-11-26 MED ORDER — BUPIVACAINE HCL 0.25 % IJ SOLN
INTRAMUSCULAR | Status: DC | PRN
  Administered 2021-11-26: 30 mL

## 2021-11-26 MED ORDER — TRANEXAMIC ACID-NACL 1000-0.7 MG/100ML-% IV SOLN
INTRAVENOUS | Status: DC | PRN
  Administered 2021-11-26: 1000 mg via INTRAVENOUS

## 2021-11-26 MED ORDER — CEFAZOLIN SODIUM-DEXTROSE 2-4 GM/100ML-% IV SOLN
2.0000 g | INTRAVENOUS | Status: AC
  Administered 2021-11-26: 2 g via INTRAVENOUS

## 2021-11-26 MED ORDER — TRANEXAMIC ACID-NACL 1000-0.7 MG/100ML-% IV SOLN
INTRAVENOUS | Status: AC
  Filled 2021-11-26: qty 100

## 2021-11-26 MED ORDER — OXYCODONE HCL 5 MG PO TABS
5.0000 mg | ORAL_TABLET | ORAL | Status: DC | PRN
  Administered 2021-11-28: 5 mg via ORAL
  Filled 2021-11-26: qty 1

## 2021-11-26 MED ORDER — PHENYLEPHRINE HCL-NACL 20-0.9 MG/250ML-% IV SOLN
INTRAVENOUS | Status: DC | PRN
  Administered 2021-11-26: 60 ug/min via INTRAVENOUS

## 2021-11-26 MED ORDER — DEXAMETHASONE SODIUM PHOSPHATE 4 MG/ML IJ SOLN
INTRAMUSCULAR | Status: AC
  Filled 2021-11-26: qty 2

## 2021-11-26 MED ORDER — DIPHENHYDRAMINE HCL 25 MG PO CAPS: 25.0000 mg | ORAL_CAPSULE | ORAL | Status: DC | PRN

## 2021-11-26 MED ORDER — BUPIVACAINE HCL (PF) 0.25 % IJ SOLN
INTRAMUSCULAR | Status: AC
  Filled 2021-11-26: qty 30

## 2021-11-26 MED ORDER — POVIDONE-IODINE 10 % EX SWAB
2.0000 "application " | Freq: Once | CUTANEOUS | Status: AC
  Administered 2021-11-26: 2 via TOPICAL

## 2021-11-26 MED ORDER — MORPHINE SULFATE (PF) 0.5 MG/ML IJ SOLN
INTRAMUSCULAR | Status: AC
  Filled 2021-11-26: qty 10

## 2021-11-26 SURGICAL SUPPLY — 36 items
BENZOIN TINCTURE PRP APPL 2/3 (GAUZE/BANDAGES/DRESSINGS) ×2 IMPLANT
CHLORAPREP W/TINT 26 (MISCELLANEOUS) ×4 IMPLANT
CLAMP CORD UMBIL (MISCELLANEOUS) ×2 IMPLANT
CLOTH BEACON ORANGE TIMEOUT ST (SAFETY) ×2 IMPLANT
DRAPE C SECTION CLR SCREEN (DRAPES) ×1 IMPLANT
DRSG OPSITE POSTOP 4X10 (GAUZE/BANDAGES/DRESSINGS) ×2 IMPLANT
ELECT REM PT RETURN 9FT ADLT (ELECTROSURGICAL) ×2
ELECTRODE REM PT RTRN 9FT ADLT (ELECTROSURGICAL) ×1 IMPLANT
EXTRACTOR VACUUM KIWI (MISCELLANEOUS) ×1 IMPLANT
EXTRACTOR VACUUM M CUP 4 TUBE (SUCTIONS) IMPLANT
GAUZE SPONGE 4X4 12PLY STRL LF (GAUZE/BANDAGES/DRESSINGS) ×2 IMPLANT
GLOVE BIOGEL PI IND STRL 7.0 (GLOVE) ×3 IMPLANT
GLOVE BIOGEL PI INDICATOR 7.0 (GLOVE) ×3
GLOVE ECLIPSE 7.0 STRL STRAW (GLOVE) ×2 IMPLANT
GOWN STRL REUS W/TWL LRG LVL3 (GOWN DISPOSABLE) ×4 IMPLANT
KIT ABG SYR 3ML LUER SLIP (SYRINGE) ×2 IMPLANT
NDL HYPO 25X5/8 SAFETYGLIDE (NEEDLE) ×1 IMPLANT
NEEDLE HYPO 22GX1.5 SAFETY (NEEDLE) ×2 IMPLANT
NEEDLE HYPO 25X5/8 SAFETYGLIDE (NEEDLE) ×2 IMPLANT
NS IRRIG 1000ML POUR BTL (IV SOLUTION) ×2 IMPLANT
PACK C SECTION WH (CUSTOM PROCEDURE TRAY) ×2 IMPLANT
PAD ABD 7.5X8 STRL (GAUZE/BANDAGES/DRESSINGS) ×2 IMPLANT
PAD OB MATERNITY 4.3X12.25 (PERSONAL CARE ITEMS) ×2 IMPLANT
RETRACTOR TRAXI PANNICULUS (MISCELLANEOUS) IMPLANT
RTRCTR C-SECT PINK 25CM LRG (MISCELLANEOUS) ×2 IMPLANT
STRIP CLOSURE SKIN 1/2X4 (GAUZE/BANDAGES/DRESSINGS) ×2 IMPLANT
SUT MNCRL 0 VIOLET CTX 36 (SUTURE) ×2 IMPLANT
SUT MONOCRYL 0 CTX 36 (SUTURE) ×2
SUT VIC AB 0 CTX 36 (SUTURE) ×1
SUT VIC AB 0 CTX36XBRD ANBCTRL (SUTURE) ×1 IMPLANT
SUT VIC AB 4-0 KS 27 (SUTURE) ×2 IMPLANT
SYR 30ML LL (SYRINGE) ×2 IMPLANT
TOWEL OR 17X24 6PK STRL BLUE (TOWEL DISPOSABLE) ×2 IMPLANT
TRAXI PANNICULUS RETRACTOR (MISCELLANEOUS) ×1
TRAY FOLEY W/BAG SLVR 14FR LF (SET/KITS/TRAYS/PACK) ×2 IMPLANT
WATER STERILE IRR 1000ML POUR (IV SOLUTION) ×2 IMPLANT

## 2021-11-26 NOTE — Progress Notes (Signed)
RN called pt and left a voicemail to see when she would be here for her c section

## 2021-11-26 NOTE — Lactation Note (Addendum)
This note was copied from a baby's chart. Lactation Consultation Note  Patient Name: Paula Shea [id number] Date: 11/26/2021 Reason for consult: Initial assessment;Term (C/S delivery) Age:34 hours P3, term female infant.  Dari interpreter used #Sara [phone]  Mom's feeding choice is breast and formula feeding.  Mom is experienced with breastfeeding, she BF 1st child for 18 months, 2nd child only 20 days. Infant has been spitty mom attempted latch infant at breast, infant briefly latched for 2 minutes, had large emesis (Curdle milk). Per mom, infant has been spitty since birth. LC changed one void and one stool diaper.  Mom attempted to hand express but colostrum was not present at this time.  Infant was not interested in latching at the breast and mom wanted supplement infant with formula, LC pace feed infant 10 mls of formula using a slow flow bottle nipple Mom knows to breastfeed infant according to hunger cues, on demand, 8 to 12 times, skin to skin. Mom's choice  is to breastfeed 1st with every feeding then afterwards she plans to supplement infant with formula. Mom knows  on Day 1: if infant latches at the breast  to offer 5-7 mls  and if infant doesn't latch to offer 5-10 mls of formula. Mom has breastfeeding supplemental sheet. Mom made aware of O/P services, breastfeeding support groups, community resources, and our phone # for post-discharge questions.   Maternal Data    Feeding Mother's Current Feeding Choice: Breast Milk and Formula Nipple Type: Slow - flow  LATCH Score Latch: Grasps breast easily, tongue down, lips flanged, rhythmical sucking.  Audible Swallowing: None  Type of Nipple: Everted at rest and after stimulation  Comfort (Breast/Nipple): Soft / non-tender  Hold (Positioning): Assistance needed to correctly position infant at breast and maintain latch.  LATCH Score: 7   Lactation Tools Discussed/Used    Interventions Interventions: Breast feeding  basics reviewed;Assisted with latch;Skin to skin;Breast compression;Adjust position;Support pillows;Position options;Expressed milk;Hand express;Education;LC Services brochure  Discharge    Consult Status Consult Status: Follow-up Date: 11/27/21 Follow-up type: In-patient    Vicente Serene 11/26/2021, 6:39 PM

## 2021-11-26 NOTE — Anesthesia Preprocedure Evaluation (Addendum)
Anesthesia Evaluation  Patient identified by MRN, date of birth, ID band Patient awake    Reviewed: Allergy & Precautions, NPO status , Patient's Chart, lab work & pertinent test results  History of Anesthesia Complications Negative for: history of anesthetic complications  Airway Mallampati: II  TM Distance: >3 FB Neck ROM: Full    Dental   Pulmonary neg pulmonary ROS,    Pulmonary exam normal        Cardiovascular negative cardio ROS Normal cardiovascular exam     Neuro/Psych negative neurological ROS     GI/Hepatic negative GI ROS, Neg liver ROS,   Endo/Other  negative endocrine ROS  Renal/GU negative Renal ROS     Musculoskeletal negative musculoskeletal ROS (+)   Abdominal   Peds  Hematology negative hematology ROS (+)   Anesthesia Other Findings   Reproductive/Obstetrics (+) Pregnancy [id number] at [redacted]w[redacted]d Prior C/S x2                           Anesthesia Physical Anesthesia Plan  ASA: 2  Anesthesia Plan: Spinal   Post-op Pain Management:    Induction:   PONV Risk Score and Plan: Ondansetron and Treatment may vary due to age or medical condition  Airway Management Planned: Natural Airway  Additional Equipment: None  Intra-op Plan:   Post-operative Plan:   Informed Consent: I have reviewed the patients History and Physical, chart, labs and discussed the procedure including the risks, benefits and alternatives for the proposed anesthesia with the patient or authorized representative who has indicated his/her understanding and acceptance.       Plan Discussed with:   Anesthesia Plan Comments:        Anesthesia Quick Evaluation

## 2021-11-26 NOTE — H&P (Signed)
OBSTETRIC ADMISSION HISTORY AND PHYSICAL  Paula Shea is a 36 y.o. female [phone] with IUP at [redacted]w[redacted]d by early [place] presenting for scheduled repeat cesarean section. She reports +FMs, no LOF, no VB, no blurry vision, headaches, peripheral edema, or RUQ pain.  She plans on breast feeding. She requests Nexplanon for birth control postpartm.  She received her prenatal care at [facility].   Dating: By [place] --->  Estimated Date of Delivery: 12/03/21  Sono:   @[redacted]w[redacted]d , normal anatomy, cephalic presentation, anterior placental lie, 3127 g, 66% EFW  Prenatal History/Complications:  History of cesarean delivery History of IUFD GBS positive   Past Medical History: History reviewed. No pertinent past medical history.  Past Surgical History: Past Surgical History:  Procedure Laterality Date   CESAREAN SECTION     2x    Obstetrical History: OB History     Gravida  4   Para  3   Term  2   Preterm  1   AB  0   Living  2      SAB  0   IAB  0   Ectopic  0   Multiple  0   Live Births  2           Social History Social History   Socioeconomic History   Marital status: Married    Spouse name: Not on file   Number of children: Not on file   Years of education: Not on file   Highest education level: Not on file  Occupational History   Not on file  Tobacco Use   Smoking status: Never   Smokeless tobacco: Never  Vaping Use   Vaping Use: Never used  Substance and Sexual Activity   Alcohol use: Never   Drug use: Never   Sexual activity: Yes    Birth control/protection: None  Other Topics Concern   Not on file  Social History Narrative   Not on file   Social Determinants of Health   Financial Resource Strain: Not on file  Food Insecurity: No Food Insecurity   Worried About in the Last Year: Never true   Ran Out of Food in the Last Year: Never true  Transportation Needs: Unmet Transportation Needs   Lack of Transportation (Medical): Yes    Lack of Transportation (Non-Medical): Yes  Physical Activity: Not on file  Stress: Not on file  Social Connections: Not on file    Family History: Family History  Problem Relation Age of Onset   Cancer Neg Hx    Diabetes Neg Hx    Hyperlipidemia Neg Hx     Allergies: No Known Allergies  Medications Prior to Admission  Medication Sig Dispense Refill Last Dose   Prenatal Vit-Fe Fumarate-FA (MULTIVITAMIN-PRENATAL) 27-0.8 MG TABS tablet Take 1 tablet by mouth daily at 12 noon. 30 tablet 11    Review of Systems  All systems reviewed and negative except as stated in HPI  Blood pressure 124/78, pulse 83, temperature 97.9 F (36.6 C), temperature source Oral, resp. rate 18, height 5' 2.99" (1.6 m), weight 85.3 kg, last menstrual period 03/17/2021, SpO2 98 %.  General appearance: alert, cooperative, and no distress Lungs: normal work of breathing on room air  Heart: normal rate, warm and well perfused  Abdomen: soft, non-tender, gravid  Extremities: no LE edema or calf tenderness to palpation   Prenatal labs: ABO, Rh: --/--/PENDING (05/26 11-26-1975) Antibody: PENDING (05/26 0839) Rubella: 2.42 (12/20 1533) RPR: NON REACTIVE (  05/24 0945)  HBsAg: Negative (12/20 1533)  HIV: Non Reactive (03/16 3500)  GBS: Positive/-- (05/04 1149)  2 hr Glucola normal Genetic screening - LR NIPS, AFP normal  Anatomy US: EIF, otherwise normal   Prenatal Transfer Tool  Maternal Diabetes: No Genetic Screening: Normal Maternal Ultrasounds/Referrals: Isolated EIF (echogenic intracardiac focus) Fetal Ultrasounds or other Referrals:  None Maternal Substance Abuse:  No Significant Maternal Medications:  None Significant Maternal Lab Results: Group B Strep positive  Results for orders placed or performed during the hospital encounter of 11/26/21 (from the past 24 hour(s))  Type and screen [facility]   Collection Time: 11/26/21  8:39 AM  Result Value Ref Range   ABO/RH(D) PENDING     Antibody Screen PENDING    Sample Expiration      11/29/2021,2359 Performed at [facility], [address] [place], [place] [address]     Patient Active Problem List   Diagnosis Date Noted   GBS (group B Streptococcus carrier), +RV culture, currently pregnant 11/08/2021   Supervision of high risk pregnancy, antepartum 06/10/2021   History of IUFD 02/19/2021   History of cesarean delivery 12/24/2020    Assessment/Plan:  Paula Shea is a 36 y.o. [id number] at [redacted]w[redacted]d here for scheduled repeat cesarean section.   The risks of surgery were discussed with the patient including but were not limited to: bleeding which may require transfusion or reoperation; infection which may require antibiotics; injury to bowel, bladder, ureters or other surrounding organs; injury to the fetus; need for additional procedures including hysterectomy in the event of a life-threatening hemorrhage; formation of adhesions; placental abnormalities with subsequent pregnancies; incisional problems; thromboembolic phenomenon and other postoperative/anesthesia complications.  The patient concurred with the proposed plan, giving informed written consent for the procedure.   Patient has been NPO since midnight she will remain NPO for procedure. Anesthesia and OR aware. Preoperative prophylactic antibiotics and SCDs ordered on call to the OR.  To OR when ready.  #Pain: Per anesthesia  #ID:  GBS positive; Ancef for surgical prophylaxis  #MOF: Breast  #MOC: Nexplanon  #Circ:  Desires   Dari interpreter used for entirety of encounter.   Paula Bores, MD  11/26/2021, 9:01 AM

## 2021-11-26 NOTE — Op Note (Cosign Needed)
Paula Shea  PROCEDURE DATE: 11/26/2021  PREOPERATIVE DIAGNOSES: Intrauterine pregnancy at [redacted]w[redacted]d weeks gestation; history of cesarean section x 2   POSTOPERATIVE DIAGNOSES: The same  PROCEDURE: Repeat Low Transverse Cesarean Section with Scar Revision  SURGEON:  Dr. Tinnie Gens  ASSISTANT:  Dr. Evalina Field   An experienced assistant was required given the standard of surgical care given the complexity of the case.  This assistant was needed for exposure, dissection, suctioning, retraction, instrument exchange, assisting with delivery with administration of fundal pressure, and for overall help during the procedure.  ANESTHESIOLOGY TEAM: Anesthesiologist: Lucretia Kern, MD CRNA: Graciela Husbands, CRNA  INDICATIONS: Paula Shea is a 36 y.o. [phone] at [redacted]w[redacted]d here for cesarean section secondary to the indications listed under preoperative diagnoses; please see preoperative note for further details.  The risks of cesarean section were discussed with the patient including but were not limited to: bleeding which may require transfusion or reoperation; infection which may require antibiotics; injury to bowel, bladder, ureters or other surrounding organs; injury to the fetus; need for additional procedures including hysterectomy in the event of a life-threatening hemorrhage; placental abnormalities wth subsequent pregnancies, incisional problems, thromboembolic phenomenon and other postoperative/anesthesia complications.   The patient concurred with the proposed plan, giving informed written consent for the procedure.    FINDINGS:  Viable female infant in cephalic presentation.  Apgars 8 and 9.  Clear amniotic fluid.  Intact placenta, three vessel cord.  Significant adhesive disease present between the fascia and underlying rectus muscles. Difficulty finding window between rectus muscles and peritoneum. Dense adhesions over anterior aspect of the uterus. Unable to place Alexis retractor due to  adhesions. Unable to visualize fallopian tubes and ovaries due to adhesions. Recommend vertical skin incision on subsequent surgery should patient become pregnant again.   ANESTHESIA: Spinal INTRAVENOUS FLUIDS: 1800 ml   ESTIMATED BLOOD LOSS: 416 ml URINE OUTPUT:  200 ml SPECIMENS: Placenta sent to L&D COMPLICATIONS: None immediate  PROCEDURE IN DETAIL:   The patient preoperatively received intravenous antibiotics and had sequential compression devices applied to her lower extremities.  She was then taken to the operating room where spinal anesthesia was administered and was found to be adequate. She was then placed in a dorsal supine position with a leftward tilt, and prepped and draped in a sterile manner.  A foley catheter was placed into her bladder and attached to constant gravity.    After an adequate timeout was performed, a Pfannenstiel skin incision was made with a scalpel just above her pre-existing scar. A similar incision was made below her scar, and  this tissue was excised. The scalpel was then used to incise down to the fascia.  The fascia was incised in the midline, and this incision was extended bilaterally using the Mayo scissors.  Kocher clamps were applied to the superior aspect of the fascial incision and the underlying rectus muscles were dissected off bluntly and sharply.  A similar process was carried out on the inferior aspect of the fascial incision.   Difficulty was encountered when trying to find a window between the rectus muscles and peritoneum. A hemostat was used to dissect through the rectus muscles bluntly and ultimately a small window was found.  The peritoneum was noted to be obliterated, with the rectus muscles scarred to the anterior aspect of the uterus.  The rectus muscles were transected bilaterally to allow for better visualization. A bladder blade and Richardson retractor were placed.    Attention was turned to the  lower uterine segment where a low  transverse hysterotomy was made with a scalpel and extended bilaterally bluntly.  Difficulty was encountered delivering the head through the hysterotomy.  A Kiwi vacuum was placed in appropriate position, and with one pull without pop off, the head was successfully delivered.  The shoulders and body were then delivered, the cord was clamped and cut after one minute, and the infant was handed over to the awaiting neonatology team. Uterine massage was then administered, and the placenta delivered intact with a three-vessel cord. The uterus was then cleared of clots and debris.    The hysterotomy was closed with 0 Monocryl in a running fashion.  The pelvis was cleared of all clot and debris. Hemostasis was confirmed on all surfaces.  The bladder blade was removed.    The fascia was then closed using 0 Vicryl in a running fashion.  The subcutaneous layer was irrigated and the skin was closed with a 4-0 Vicryl subcuticular stitch. 30 cc of 0.25 % Marcaine with 40 mg of Kenalog was injected.  The patient tolerated the procedure well. Sponge, instrument and needle counts were correct x 3.  She was taken to the recovery room in stable condition.   Evalina Field, MD  OB Fellow  Faculty Practice

## 2021-11-26 NOTE — Discharge Summary (Signed)
Postpartum Discharge Summary  Date of Service updated***     Patient Name: Paula Shea DOB: 04-15-1986 MRN: [id number]  Date of admission: 11/26/2021 Delivery date:11/26/2021  Delivering provider: Donnamae Jude  Date of discharge: 11/26/2021  Admitting diagnosis: Previous cesarean delivery affecting pregnancy, antepartum [O34.219] Intrauterine pregnancy: [redacted]w[redacted]d     Secondary diagnosis:  Principal Problem:   Status post repeat low transverse cesarean section Active Problems:   History of cesarean delivery   History of IUFD   Supervision of high risk pregnancy, antepartum   GBS (group B Streptococcus carrier), +RV culture, currently pregnant   Previous cesarean delivery affecting pregnancy, antepartum  Additional problems: Acute blood loss anemia ***    Discharge diagnosis: Term Pregnancy Delivered                                              Post partum procedures: {Postpartum procedures:23558} Augmentation: N/A Complications: None  Hospital course: Sceduled C/S - 36 y.o. yo [phone] at [redacted]w[redacted]d was admitted to the hospital 11/26/2021 for scheduled cesarean section with the following indication: History of cesarean section x 2.  Delivery details are as follows:  Membrane Rupture Time/Date: 10:14 AM ,11/26/2021   Delivery Method:C-Section, Vacuum Assisted  Details of operation can be found in separate operative note.  Patient had an uncomplicated postpartum course.  Her hemoglobin on POD#1 was ***, for which she received ***.  She is ambulating, tolerating a regular diet, passing flatus, and urinating well.  Her pain and bleeding are controlled.  Patient is discharged home in stable condition on  11/26/21        Newborn Data: Birth date:11/26/2021  Birth time:10:16 AM  Gender:Female  Living status:Living  Apgars:8 ,9  Weight:3800 g     Magnesium Sulfate received: No BMZ received: No Rhophylac: N/A MMR: N/A - Immune  T-DaP: Given prenatally Flu: Given prenatally  Transfusion:  {Transfusion received:30440034}  Physical exam  Vitals:   11/26/21 1224 11/26/21 1330 11/26/21 1436 11/26/21 1547  BP: (!) 101/57 107/66 110/63 (!) 91/48  Pulse: 73 64 66 65  Resp: $Remo'16 16 16 14  'ytLFY$ Temp: (!) 97.4 F (36.3 C) 97.6 F (36.4 C) 97.8 F (36.6 C) 97.6 F (36.4 C)  TempSrc: Axillary Oral Oral Oral  SpO2: 97% 98% 96% 97%  Weight:      Height:       General: {Exam; general:21111117} Lochia: {Desc; appropriate/inappropriate:30686::"appropriate"} Uterine Fundus: {Desc; firm/soft:30687} Incision: {Exam; incision:21111123} DVT Evaluation: {Exam; dvt:2111122}  Labs: Lab Results  Component Value Date   WBC 7.0 11/24/2021   HGB 11.6 (L) 11/24/2021   HCT 35.2 (L) 11/24/2021   MCV 93.4 11/24/2021   PLT 210 11/24/2021       View : No data to display.         Edinburgh Score:     View : No data to display.           After visit meds:  Allergies as of 11/26/2021   No Known Allergies   Med Rec must be completed prior to using this [facility]***        Discharge home in stable condition Infant Feeding: Breast Infant Disposition: {CHL IP OB HOME WITH OINOMV:67209} Discharge instruction: per After Visit Summary and Postpartum booklet. Activity: Advance as tolerated. Pelvic rest for 6 weeks.  Diet: routine diet Future Appointments:No future appointments. Follow up Visit:  Message sent to [facility] by Dr. Gwenlyn Perking on 11/26/21.   Please schedule this patient for a In person postpartum visit in 6 weeks with the following provider: Any provider. Additional Postpartum F/U:Incision check 1 week  High risk pregnancy complicated by:  Hx of CS x 2 Delivery mode:  C-Section, Vacuum Assisted  Anticipated Birth Control:  Nexplanon  11/26/2021 Genia Del, MD

## 2021-11-26 NOTE — Transfer of Care (Signed)
Immediate Anesthesia Transfer of Care Note  Patient: Paula Shea  Procedure(s) Performed: CESAREAN SECTION  Patient Location: PACU  Anesthesia Type:Spinal  Level of Consciousness: awake, alert  and oriented  Airway & Oxygen Therapy: Patient Spontanous Breathing  Post-op Assessment: Report given to RN and Post -op Vital signs reviewed and stable  Post vital signs: Reviewed and stable  Last Vitals:  Vitals Value Taken Time  BP    Temp    Pulse 84 11/26/21 1109  Resp 18 11/26/21 1109  SpO2 97 % 11/26/21 1109  Vitals shown include unvalidated device data.  Last Pain:  Vitals:   11/26/21 0842  TempSrc: Oral  PainSc: 0-No pain         Complications: No notable events documented.

## 2021-11-26 NOTE — Anesthesia Procedure Notes (Signed)
Spinal ° °Patient location during procedure: OR °Reason for block: surgical anesthesia °Staffing °Performed: anesthesiologist  °Anesthesiologist: Murl Golladay E, MD °Preanesthetic Checklist °Completed: patient identified, IV checked, risks and benefits discussed, surgical consent, monitors and equipment checked, pre-op evaluation and timeout performed °Spinal Block °Patient position: sitting °Prep: DuraPrep and site prepped and draped °Patient monitoring: continuous pulse ox, blood pressure and heart rate °Approach: midline °Location: L3-4 °Injection technique: single-shot °Needle °Needle type: Pencan  °Needle gauge: 24 G °Needle length: 10 cm °Assessment °Events: CSF return °Additional Notes °Functioning IV was confirmed and monitors were applied. Sterile prep and drape, including hand hygiene and sterile gloves were used. The patient was positioned and the spine was prepped. The skin was anesthetized with lidocaine.  Free flow of clear CSF was obtained prior to injecting local anesthetic into the CSF. The needle was carefully withdrawn. The patient tolerated the procedure well.  ° ° ° °

## 2021-11-26 NOTE — Anesthesia Postprocedure Evaluation (Signed)
Anesthesia Post Note  Patient: Raeonna Milo  Procedure(s) Performed: CESAREAN SECTION     Patient location during evaluation: PACU Anesthesia Type: Spinal Level of consciousness: oriented and awake and alert Pain management: pain level controlled Vital Signs Assessment: post-procedure vital signs reviewed and stable Respiratory status: spontaneous breathing, respiratory function stable and nonlabored ventilation Cardiovascular status: blood pressure returned to baseline and stable Postop Assessment: no headache, no backache, no apparent nausea or vomiting and spinal receding Anesthetic complications: no   No notable events documented.  Last Vitals:  Vitals:   11/26/21 1330 11/26/21 1436  BP: 107/66 110/63  Pulse: 64 66  Resp: 16 16  Temp: 36.4 C 36.6 C  SpO2: 98% 96%    Last Pain:  Vitals:   11/26/21 1436  TempSrc: Oral  PainSc:    Pain Goal:                   Lucretia Kern

## 2021-11-27 LAB — CBC
HCT: 30.2 % — ABNORMAL LOW (ref 36.0–46.0)
Hemoglobin: 10.3 g/dL — ABNORMAL LOW (ref 12.0–15.0)
MCH: 31.7 pg (ref 26.0–34.0)
MCHC: 34.1 g/dL (ref 30.0–36.0)
MCV: 92.9 fL (ref 80.0–100.0)
Platelets: 251 10*3/uL (ref 150–400)
RBC: 3.25 MIL/uL — ABNORMAL LOW (ref 3.87–5.11)
RDW: 13.1 % (ref 11.5–15.5)
WBC: 12.7 10*3/uL — ABNORMAL HIGH (ref 4.0–10.5)
nRBC: 0 % (ref 0.0–0.2)

## 2021-11-27 LAB — CREATININE, SERUM
Creatinine, Ser: 0.75 mg/dL (ref 0.44–1.00)
GFR, Estimated: >60 mL/min (ref >60)

## 2021-11-27 LAB — BIRTH TISSUE RECOVERY COLLECTION (PLACENTA DONATION)

## 2021-11-27 MED ORDER — ETONOGESTREL 68 MG ~~LOC~~ IMPL: 68.0000 mg | DRUG_IMPLANT | Freq: Once | SUBCUTANEOUS | Status: DC

## 2021-11-27 MED ORDER — LIDOCAINE HCL 1 % IJ SOLN: 0.0000 mL | Freq: Once | INTRAMUSCULAR | Status: DC | PRN

## 2021-11-27 NOTE — Progress Notes (Signed)
Subjective: Postpartum Day 1: Cesarean Delivery Patient reports incisional pain, tolerating PO, and no problems voiding.    Objective: Vital signs in last 24 hours: Temp:  [97.4 F (36.3 C)-98.9 F (37.2 C)] 98.9 F (37.2 C) (05/27 0445) Pulse Rate:  [61-73] 61 (05/27 0445) Resp:  [8-20] 18 (05/27 0445) BP: (91-123)/(47-67) 100/64 (05/27 0445) SpO2:  [95 %-99 %] 98 % (05/27 0445)  Physical Exam:  General: alert, cooperative, and appears stated age Lochia: appropriate Uterine Fundus: firm Incision: no significant drainage DVT Evaluation: No evidence of DVT seen on physical exam.  Recent Labs    11/24/21 0945 11/27/21 0506  HGB 11.6* 10.3*  HCT 35.2* 30.2*    Assessment/Plan: Status post Cesarean section. Doing well postoperatively.  Continue current care Nexplanon later, declines intra-hospital placement. Appropriate drop in Hgb. Circumcision consent obtained. Benefits of circumcision Circumcised boys seem to have slightly lower rates of: ?Urinary tract infections ?Swelling of the opening at the tip of the penis ?Penis cancer ?HIV and other infections that you catch during sex ?Cervical cancer in the women they have sex with  Risks of circumcision ?Bleeding or infection from the surgery ?Damage to or amputation of the penis ?A chance that the doctor will cut off too much or not enough of the foreskin ?A chance that sex won't feel as good later in life Only about 1 out of every 200 circumcisions leads to problems.   Reva Bores 11/27/2021, 9:30 AM

## 2021-11-28 MED ORDER — IBUPROFEN 600 MG PO TABS: 600.0000 mg | ORAL_TABLET | Freq: Four times a day (QID) | ORAL | 1 refills | Status: AC | PRN

## 2021-11-28 MED ORDER — OXYCODONE-ACETAMINOPHEN 5-325 MG PO TABS: 1.0000 | ORAL_TABLET | Freq: Four times a day (QID) | ORAL | 0 refills | Status: DC | PRN

## 2021-11-30 ENCOUNTER — Telehealth: Payer: Self-pay

## 2021-11-30 ENCOUNTER — Encounter: Payer: Medicaid Other | Admitting: Family Medicine

## 2021-11-30 NOTE — Telephone Encounter (Signed)
Pt called office to report continued swelling in both legs. Denies headache, blurry vision, or dizziness. Pt reports some pain on right side of incision. Reports taking pain medication 3 times a day. Recommended pt elevate legs, rest, and increase hydration. Reviewed upcoming appt for incision check on 12/03/21. Pt will call back with any worsening symptoms.  Telephone call interpreted by [facility] interpreter Verdis Frederickson ID [phone].

## 2021-12-01 ENCOUNTER — Other Ambulatory Visit: Payer: Self-pay | Admitting: Family Medicine

## 2021-12-03 ENCOUNTER — Ambulatory Visit (INDEPENDENT_AMBULATORY_CARE_PROVIDER_SITE_OTHER): Payer: Medicaid Other

## 2021-12-03 ENCOUNTER — Other Ambulatory Visit: Payer: Self-pay | Admitting: Obstetrics and Gynecology

## 2021-12-03 VITALS — BP 124/61 | HR 69 | Temp 98.4°F | Wt 177.0 lb

## 2021-12-03 DIAGNOSIS — Z5189 Encounter for other specified aftercare: Secondary | ICD-10-CM

## 2021-12-03 DIAGNOSIS — O8600 Infection of obstetric surgical wound, unspecified: Secondary | ICD-10-CM

## 2021-12-03 MED ORDER — OXYCODONE-ACETAMINOPHEN 5-325 MG PO TABS: 1.0000 | ORAL_TABLET | Freq: Four times a day (QID) | ORAL | 0 refills | Status: AC | PRN

## 2021-12-03 MED ORDER — CEPHALEXIN 500 MG PO CAPS: 500.0000 mg | ORAL_CAPSULE | Freq: Four times a day (QID) | ORAL | 0 refills | Status: AC

## 2021-12-03 NOTE — Progress Notes (Signed)
Incision Check Visit  Paula Shea is here for incision check following repeat c-section on 11/26/21. Pt reports taking ibuprofen and Percocet for pain. Denies any pain at this time. Honeycomb dressing removed from incision. Open area visualized right side of incision. [place] MD to bedside for assessment, who recommends course of antibiotics to prevent infection and steri strips to reinforce surrounding area. Open area to be left open to allow drainage from wound bed. Reviewed wound care and s/s of infection with patient. Will return in 1 week for reassessment.   Pt reports issues with milk supply and latch. Desires lactation appt; will schedule during check out.   Encounter completed with [facility] interpreter Paula Shea ID [id number].  Paula Bicker, RN 12/03/2021  9:34 AM

## 2022-01-06 ENCOUNTER — Ambulatory Visit: Payer: Medicaid Other | Admitting: Family Medicine

## 2022-01-21 ENCOUNTER — Ambulatory Visit: Payer: Medicaid Other | Admitting: Certified Nurse Midwife

## 2022-01-24 ENCOUNTER — Encounter: Payer: Self-pay | Admitting: Medical

## 2022-01-24 ENCOUNTER — Ambulatory Visit (INDEPENDENT_AMBULATORY_CARE_PROVIDER_SITE_OTHER): Payer: Medicaid Other | Admitting: Medical

## 2022-01-24 ENCOUNTER — Other Ambulatory Visit: Payer: Self-pay

## 2022-01-24 DIAGNOSIS — Z3009 Encounter for other general counseling and advice on contraception: Secondary | ICD-10-CM

## 2022-01-24 DIAGNOSIS — Z5941 Food insecurity: Secondary | ICD-10-CM

## 2022-01-24 DIAGNOSIS — Z98891 History of uterine scar from previous surgery: Secondary | ICD-10-CM

## 2022-01-24 DIAGNOSIS — Z30017 Encounter for initial prescription of implantable subdermal contraceptive: Secondary | ICD-10-CM

## 2022-01-24 DIAGNOSIS — Z3202 Encounter for pregnancy test, result negative: Secondary | ICD-10-CM

## 2022-01-24 LAB — POCT PREGNANCY, URINE: Preg Test, Ur: NEGATIVE

## 2022-01-24 MED ORDER — ETONOGESTREL 68 MG ~~LOC~~ IMPL
68.0000 mg | DRUG_IMPLANT | Freq: Once | SUBCUTANEOUS | Status: AC
  Administered 2022-01-24: 68 mg via SUBCUTANEOUS

## 2022-01-24 NOTE — Progress Notes (Signed)
Post Partum Visit Note  Paula Shea is a 36 y.o. [phone] female who presents for a postpartum visit. She is 8 weeks and 3 days postpartum following a repeat cesarean section.  I have fully reviewed the prenatal and intrapartum course. The delivery was at [redacted]w[redacted]d gestational weeks.  Anesthesia: spinal. Postpartum course has been going well per patient. Baby is doing well per patient. Baby is feeding by bottle - Similac Advance. Bleeding no bleeding. Bowel function is normal. Bladder function is normal. Patient is not sexually active. Contraception method is abstinence. Postpartum depression screening: negative.   Upstream - 01/24/22 0958       Pregnancy Intention Screening   Does the patient want to become pregnant in the next year? No    Does the patient's partner want to become pregnant in the next year? No    Would the patient like to discuss contraceptive options today? No      Contraception Wrap Up   Current Method Abstinence    End Method Hormonal Implant    Contraception Counseling Provided Yes            The pregnancy intention screening data noted above was reviewed. Potential methods of contraception were discussed. The patient elected to proceed with Hormonal Implant.   Edinburgh Postnatal Depression Scale - 01/24/22 0937       Edinburgh Postnatal Depression Scale:  In the Past 7 Days   I have been able to laugh and see the funny side of things. 0    I have looked forward with enjoyment to things. 0    I have blamed myself unnecessarily when things went wrong. 0    I have been anxious or worried for no good reason. 0    I have felt scared or panicky for no good reason. 0    Things have been getting on top of me. 0    I have been so unhappy that I have had difficulty sleeping. 0    I have felt sad or miserable. 0    I have been so unhappy that I have been crying. 0    The thought of harming myself has occurred to me. 0    Edinburgh Postnatal Depression Scale Total  0             The following portions of the patient's history were reviewed and updated as appropriate: allergies, current medications, past family history, past medical history, past social history, past surgical history, and problem list.  Review of Systems Pertinent items are noted in HPI.  Objective:  BP 116/83   Pulse (!) 59   Wt 166 lb 9.6 oz (75.6 kg)   LMP 01/12/2022 (Approximate)   Breastfeeding No   BMI 29.52 kg/m    General:  alert and cooperative   Breasts:  not indicated  Lungs: clear to auscultation bilaterally  Heart:  regular rate and rhythm, S1, S2 normal, no murmur, click, rub or gallop  Abdomen: soft, non-tender; bowel sounds normal; no masses,  no organomegaly   Wound well approximated incision  GU exam:  not indicated         GYNECOLOGY CLINIC PROCEDURE NOTE  Nexplanon Insertion Procedure Patient was given informed consent, she signed consent form.  Patient does understand that irregular bleeding is a very common side effect of this medication. She was advised to have backup contraception for one week after placement. Pregnancy test in clinic today was negative.  Appropriate time out taken.  Patient's  left arm was prepped and draped in the usual sterile fashion.. The ruler used to measure and mark insertion area.  Patient was prepped with alcohol swab and then injected with 3 ml of 1% lidocaine.  She was prepped with betadine, Nexplanon removed from packaging,  Device confirmed in needle, then inserted full length of needle and withdrawn per handbook instructions. Nexplanon was able to palpated in the patient's arm; patient palpated the insert herself. There was minimal blood loss.  Patient insertion site covered with guaze and a pressure bandage to reduce any bruising.  The patient tolerated the procedure well and was given post procedure instructions.   Assessment:   Normal postpartum exam. S/P C/S Unwanted fertility  Nexplanon insertion   Plan:    Essential components of care per ACOG recommendations:  1.  Mood and well being: Patient with negative depression screening today. Reviewed local resources for support.  - Patient tobacco use? No.   - hx of drug use? No.    2. Infant care and feeding:  -Patient currently breastmilk feeding? No.  -Social determinants of health (SDOH) reviewed in EPIC. The following needs were identified- formula  3. Sexuality, contraception and birth spacing - Patient does not want a pregnancy in the next year.  Desired family size is 4 children.  - Reviewed reproductive life planning. Reviewed contraceptive methods based on pt preferences and effectiveness.  Patient desired Hormonal Implant today.   - Discussed birth spacing of 18 months  4. Sleep and fatigue -Encouraged family/partner/community support of 4 hrs of uninterrupted sleep to help with mood and fatigue  5. Physical Recovery  - Discussed patients delivery and complications.  - Patient had a C-section repeat; no problems after deliver.  - Patient has urinary incontinence? No. - Patient is safe to resume physical and sexual activity  6.  Health Maintenance - HM due items addressed Yes - Last pap smear  Diagnosis  Date Value Ref Range Status  12/24/2020   Final   - Negative for intraepithelial lesion or malignancy (NILM)   Pap smear not done at today's visit.  -Breast Cancer screening indicated? No.   7. Chronic Disease/Pregnancy Condition follow up: None  Vonzella Nipple, PA-C [facility] [employer], [facility]

## 2024-01-24 ENCOUNTER — Ambulatory Visit: Admitting: Obstetrics and Gynecology

## 2024-03-19 ENCOUNTER — Ambulatory Visit: Admitting: Obstetrics and Gynecology

## 2024-04-02 ENCOUNTER — Ambulatory Visit: Admitting: Obstetrics and Gynecology

## 2024-04-02 ENCOUNTER — Other Ambulatory Visit: Payer: Self-pay

## 2024-04-02 ENCOUNTER — Encounter: Payer: Self-pay | Admitting: Obstetrics and Gynecology

## 2024-04-02 VITALS — BP 126/90 | HR 74 | Ht 62.99 in | Wt 174.1 lb

## 2024-04-02 DIAGNOSIS — Z3046 Encounter for surveillance of implantable subdermal contraceptive: Secondary | ICD-10-CM

## 2024-04-02 NOTE — Progress Notes (Signed)
     GYNECOLOGY OFFICE PROCEDURE NOTE  Paula Shea is a 38 y.o. [phone] here for Nexplanon  removal.  Last pap smear was on 12/24/20 and was normal. Pt declined pap today due to needing female provider. No other gynecologic concerns.  Nexplanon  Removal Patient identified, informed consent performed, consent signed.   Appropriate time out taken. Nexplanon  site identified.  Area prepped in usual sterile fashon. One ml of 1% lidocaine  was used to anesthetize the area at the distal end of the implant. A small stab incision was made right beside the implant on the distal portion.  The Nexplanon  rod was grasped using hemostats and removed without difficulty.  There was minimal blood loss. There were no complications.  3 ml of 1% lidocaine  was injected around the incision for post-procedure analgesia.  Steri-strips were applied over the small incision.  A pressure bandage was applied to reduce any bruising.  The patient tolerated the procedure well and was given post procedure instructions.  Patient is planning attempt conception.  Reviewed patient's op note and patient had extensive adhesions and will need a vertical incision. Advised patient that she and spouse may want to come in for consultation regarding delivery of next pregnancy.  Pt will need to follow up in about a month for pap with female provider.    Jerilynn Buddle, MD, FACOG Obstetrician & Gynecologist, [facility] for [facility], [facility]

## 2024-04-02 NOTE — Patient Instructions (Signed)
 Neosporin for the removal site

## 2024-05-24 ENCOUNTER — Encounter: Payer: Self-pay | Admitting: Certified Nurse Midwife

## 2024-05-24 ENCOUNTER — Other Ambulatory Visit (HOSPITAL_COMMUNITY)
Admission: RE | Admit: 2024-05-24 | Discharge: 2024-05-24 | Disposition: A | Source: Ambulatory Visit | Attending: Certified Nurse Midwife | Admitting: Certified Nurse Midwife

## 2024-05-24 ENCOUNTER — Other Ambulatory Visit: Payer: Self-pay

## 2024-05-24 ENCOUNTER — Ambulatory Visit: Admitting: Certified Nurse Midwife

## 2024-05-24 VITALS — BP 113/72 | HR 79 | Wt 171.2 lb

## 2024-05-24 DIAGNOSIS — Z124 Encounter for screening for malignant neoplasm of cervix: Secondary | ICD-10-CM | POA: Insufficient documentation

## 2024-05-24 DIAGNOSIS — Z3169 Encounter for other general counseling and advice on procreation: Secondary | ICD-10-CM

## 2024-05-24 DIAGNOSIS — Z758 Other problems related to medical facilities and other health care: Secondary | ICD-10-CM

## 2024-05-24 DIAGNOSIS — Z01419 Encounter for gynecological examination (general) (routine) without abnormal findings: Secondary | ICD-10-CM

## 2024-05-24 NOTE — Progress Notes (Signed)
   Subjective:     Paula Shea is a 38 y.o. female here at [facility] for a routine exam.  Current complaints: none.  Personal and family health history reviewed: yes.  Do you have a primary care provider? NO Do you feel safe at home? Yes  Flowsheet Row Routine [facility]  PHQ-2 Total Score 0    Health Maintenance Due  Topic Date Due   Hepatitis B Vaccines 19-59 Average Risk (1 of 3 - 19+ 3-dose series) Never done   HPV VACCINES (1 - 3-dose SCDM series) Never done   Influenza Vaccine  02/02/2024     Risk factors for chronic health problems: Smoking: Alchohol/how much: Pt BMI: Body mass index is 30.34 kg/m.   Gynecologic History Patient's last menstrual period was 04/28/2024 (approximate). Contraception: none, recent removal of Nexplanon . TTC Last Pap: 12/2020 Collected today. Results were: normal Last mammogram: Never, due to age.  Obstetric History OB History  Gravida Para Term Preterm AB Living  4 4 3 1  0 3  SAB IAB Ectopic Multiple Live Births  0 0 0 0 3    # Outcome Date GA Lbr Len/2nd Weight Sex Type Anes PTL Lv  4 Term 11/26/21 [redacted]w[redacted]d  8 lb 6 oz (3.8 kg) M CS-Vac Spinal  LIV  3 Preterm 01/24/21 [redacted]w[redacted]d 01:31 / 00:13 1 lb 0.2 oz (0.459 kg) M Vag-Spont EPI  FD     Birth Comments: IUFD  2 Term 01/07/17 [redacted]w[redacted]d  8 lb 13.1 oz (4 kg) M CS-Unspec EPI N LIV  1 Term 02/15/15 [redacted]w[redacted]d  8 lb 13.1 oz (4 kg) M CS-Unspec EPI  LIV     The following portions of the patient's history were reviewed and updated as appropriate: allergies, current medications, past family history, past medical history, past social history, past surgical history, and problem list.  Review of Systems Pertinent items are noted in HPI.    Objective:   Today's Vitals   05/24/24 1139  BP: 113/72  Pulse: 79  Weight: 171 lb 3.2 oz (77.7 kg)   Body mass index is 30.34 kg/m.  VS reviewed, nursing note reviewed,   Constitutional: well developed, well nourished, no distress HEENT: normocephalic, thyroid without enlargement or mass HEART: RRR, no murmurs rubs/gallops RESP: clear and equal to auscultation bilaterally in all lobes  Breast Exam:  Deferred with low risks and shared decision making Abdomen: soft Neuro: alert and oriented x 3 Skin: warm, dry Psych: affect normal Pelvic exam: Performed: Cervix pink, visually closed, without lesion, scant white creamy discharge, vaginal walls and external genitalia normal. Pap collected.        Assessment/Plan:   1. Well woman exam (Primary) - PE WNL  2. Screening for cervical cancer - Pap today.   3. Encounter for preconception consultation - Husband present for counseling. He verbalizes that he is concerned for his wife's safety regarding conception and pregnancy and delivery.  - Previously cautioned regarding risks due to recommendation for vertical incision.  - Counseled on risks after reviewing previous visit and operative note. Patients require further counseling from surgeon. Recommend rescheduling with OB within a month.    4. Language barrier.  - In-person [facility] interpreter used for entire episode of care.    Return in about 1 month (around 06/23/2024).   Camie DELENA Rote, CNM 12:29 PM

## 2024-05-29 LAB — CYTOLOGY - PAP
Comment: NEGATIVE
Diagnosis: NEGATIVE
High risk HPV: NEGATIVE

## 2024-06-04 ENCOUNTER — Ambulatory Visit: Payer: Self-pay | Admitting: Certified Nurse Midwife

## 2024-06-04 NOTE — Progress Notes (Signed)
 Normal pap results. Next pap due 2028.   Camie Rote, MSN, CNM, RNC-OB Certified Nurse Midwife, [facility] 06/04/2024 8:08 AM

## 2024-07-18 ENCOUNTER — Encounter: Payer: Self-pay | Admitting: Obstetrics and Gynecology

## 2024-07-18 ENCOUNTER — Ambulatory Visit: Admitting: Obstetrics and Gynecology

## 2024-07-18 DIAGNOSIS — N736 Female pelvic peritoneal adhesions (postinfective): Secondary | ICD-10-CM | POA: Insufficient documentation
